# Patient Record
Sex: Male | Born: 1980 | Race: Black or African American | Hispanic: No | Marital: Single | Smoking: Current every day smoker
Health system: Southern US, Community
[De-identification: ages and names within clinical notes are randomized; demographics above are authoritative.]

## PROBLEM LIST (undated history)

## (undated) DIAGNOSIS — F909 Attention-deficit hyperactivity disorder, unspecified type: Secondary | ICD-10-CM

## (undated) DIAGNOSIS — Z9109 Other allergy status, other than to drugs and biological substances: Secondary | ICD-10-CM

## (undated) DIAGNOSIS — F419 Anxiety disorder, unspecified: Secondary | ICD-10-CM

## (undated) DIAGNOSIS — J45909 Unspecified asthma, uncomplicated: Secondary | ICD-10-CM

---
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
## 2008-02-02 ENCOUNTER — Emergency Department (HOSPITAL_COMMUNITY): Admission: EM | Admit: 2008-02-02 | Discharge: 2008-02-02 | Payer: Self-pay | Admitting: Emergency Medicine

## 2008-02-05 ENCOUNTER — Emergency Department (HOSPITAL_COMMUNITY): Admission: EM | Admit: 2008-02-05 | Discharge: 2008-02-05 | Payer: Self-pay | Admitting: Emergency Medicine

## 2009-11-21 ENCOUNTER — Emergency Department (HOSPITAL_COMMUNITY): Admission: EM | Admit: 2009-11-21 | Discharge: 2009-11-21 | Payer: Self-pay | Admitting: Family Medicine

## 2011-01-09 ENCOUNTER — Inpatient Hospital Stay (INDEPENDENT_AMBULATORY_CARE_PROVIDER_SITE_OTHER)
Admission: RE | Admit: 2011-01-09 | Discharge: 2011-01-09 | Disposition: A | Discharge status: Home / Self Care | Payer: Self-pay | Source: Ambulatory Visit | Attending: Family Medicine | Admitting: Family Medicine

## 2011-01-09 DIAGNOSIS — M799 Soft tissue disorder, unspecified: Secondary | ICD-10-CM

## 2011-01-09 DIAGNOSIS — IMO0002 Reserved for concepts with insufficient information to code with codable children: Secondary | ICD-10-CM

## 2011-07-31 ENCOUNTER — Emergency Department (INDEPENDENT_AMBULATORY_CARE_PROVIDER_SITE_OTHER)
Admission: EM | Admit: 2011-07-31 | Discharge: 2011-07-31 | Disposition: A | Discharge status: Home / Self Care | Payer: Self-pay | Source: Home / Self Care | Attending: Emergency Medicine | Admitting: Emergency Medicine

## 2011-07-31 ENCOUNTER — Encounter (HOSPITAL_COMMUNITY): Payer: Self-pay | Admitting: *Deleted

## 2011-07-31 DIAGNOSIS — R112 Nausea with vomiting, unspecified: Secondary | ICD-10-CM

## 2011-07-31 MED ORDER — ONDANSETRON HCL 4 MG PO TABS: 4.0000 mg | ORAL_TABLET | Freq: Three times a day (TID) | ORAL | Status: AC | PRN

## 2011-07-31 NOTE — Discharge Instructions (Signed)
Go to www.goodrx.com to look up your medications. This will give you a list of where you can find your prescriptions at the most affordable prices.   Call [facility]  [phone]  If you have no primary doctor, here are some resources that may be helpful:  Medicaid-accepting [place] Providers:   - [facility]- [address]      [phone]      Mon-Fri 9am-7pm, Sat 9am-1pm   - [facility]- [address], [address]      [phone]   - [facility]- [address]      [phone]   - [facility]- [address] [place] [address]      [phone]   - [facility]- [address]      [phone]      Only accepts [place] Access Medicaid patients       after they have her name applied to their card   Self Pay (no insurance) in [place]:   - Sickle Cell Patients: Dr Kamuela Dean, [facility]      [address]      [phone]   - [facility]- [phone]   - Physician Referral Service- [phone]   - [facility]- [address]      [phone]   - [facility]- [address]   - [facility]- see information above      (Speak to Pam H if you do not have insurance)   - [facility]- [address]      [phone]   - [facility] [place]- [address]      [phone]   - [facility]- [address] [place] [address]      [phone]   - Dr Osei-Bonsu-  [address], [place]      [phone]   - [facility]- [address]      [phone]   - [facility]- [address] [place] [address]      [phone]      Also [address]      [phone]   - [facility]- [address]      [phone]      1st and 3rd Saturday every month, 10am-1pm    Other agencies that provide inexpensive medical care:     [facility]  [phone]     Internal Medicine  [phone]    [facility]  [phone]    Women's Clinic  [phone] [place] [address]    [facility]  [phone]    [facility]  [phone] [facility] [phone]   [address] [place],  [phone]  Chronic Pain Problems Contact  [facility]  [phone] Patients need to be referred by their primary care doctor.  [place] Resources  [facility] [place]     [employer]                          [place] [facility] [address] [place]                         [address]      [address]   [phone] (After Hours)  General Information: Finding a doctor when you do not have health insurance can be tricky. Although you are not limited by an insurance plan, you are of course limited by her finances and how much but he can pay out of pocket.  What are your options if you don't have health insurance?   1) Find a Doctor and Pay Out of Pocket Although you won't have to find out who is covered by your insurance plan, it is a good idea to ask around and get recommendations. You will then need to call the office and see if the doctor you have chosen will accept you as a new patient and what types of options they offer for patients who are self-pay. Some doctors offer discounts or will set up payment plans for their patients who do not have insurance, but you will need to ask so you aren't surprised when you get to your appointment.  2) Contact Your Local Health Department Not all health departments have doctors that can see patients for sick visits, but many do, so it is worth a call to see if yours does. If you don't know where your local health department is, you can check in your phone book. The CDC also has a tool to help you locate your state's health department, and many state websites also have listings of  all of their local health departments.  3) Find a Walk-in Clinic If your illness is not likely to be very severe or complicated, you may want to try a walk in clinic. These are popping up all over the country in pharmacies, drugstores, and shopping centers. They're usually staffed by nurse practitioners or physician assistants that have been trained to treat common illnesses and complaints. They're usually fairly quick and inexpensive. However, if you have serious medical issues or chronic medical problems, these are probably not your best option     

## 2011-07-31 NOTE — ED Provider Notes (Signed)
History     CSN: [id number]  Arrival date & time 07/31/11  1345   First MD Initiated Contact with Patient 07/31/11 1402      Chief Complaint  Patient presents with  . Nausea  . Emesis    (Consider location/radiation/quality/duration/timing/severity/associated sxs/prior treatment) HPI Comments: Patient reports 10 minutes of retching and vomiting after "doing shots" of vodka last night. Patient started vomiting around 4 AM. Reports vomiting of stomach contents. No blood, bile. There is no abdominal pain. The vomiting has since resolved, feel somewhat nauseous, but is hungry. He is able to drink fluids without difficulty. No chest pain, shortness of breath. Denies dizziness, presyncope, lightheadedness. No urinary complaints, no decreased urine output. States he ate steak and cheese for dinner last night,. No leftovers, recent travel, no sick contacts. No medication or antibiotic use. States he drinks rarely, and last night was significantly more alcohol than he usually drinks. Denies any coingestants or concurrent illicit substance use. Patient is otherwise healthy.   ROS as noted in HPI. All other ROS negative.   Patient is a 31 y.o. male presenting with vomiting. The history is provided by the patient. No language interpreter was used.  Emesis  This is a new problem. The current episode started 6 to 12 hours ago. The problem has been resolved. The emesis has an appearance of stomach contents. There has been no fever. Pertinent negatives include no abdominal pain, no chills, no cough, no diarrhea, no fever, no myalgias and no URI.    History reviewed. No pertinent past medical history.  History reviewed. No pertinent past surgical history.  History reviewed. No pertinent family history.  History  Substance Use Topics  . Smoking status: Current Everyday Smoker  . Smokeless tobacco: Not on file  . Alcohol Use: Yes      Review of Systems  Constitutional: Negative for fever and  chills.  Respiratory: Negative for cough.   Gastrointestinal: Positive for vomiting. Negative for abdominal pain and diarrhea.  Musculoskeletal: Negative for myalgias.    Allergies  Review of patient's allergies indicates no known allergies.  Home Medications   Current Outpatient Rx  Name Route Sig Dispense Refill  . ONDANSETRON HCL 4 MG PO TABS Oral Take 1 tablet (4 mg total) by mouth every 8 (eight) hours as needed for nausea. May cause constipation 20 tablet 0    BP 128/88  Pulse 73  Temp(Src) 98 F (36.7 C) (Oral)  Resp 18  SpO2 99%  Physical Exam  Nursing note and vitals reviewed. Constitutional: He is oriented to person, place, and time. He appears well-developed and well-nourished. No distress.        Mucous membranes moist. Appears well hydrated  HENT:  Head: Normocephalic and atraumatic.  Eyes: Conjunctivae and EOM are normal.  Neck: Normal range of motion.  Cardiovascular: Normal rate, regular rhythm and normal heart sounds.   Pulmonary/Chest: Effort normal and breath sounds normal. No respiratory distress.  Abdominal: Soft. Normal appearance and bowel sounds are normal. He exhibits no distension and no mass. There is no tenderness. There is no rebound and no guarding.  Musculoskeletal: Normal range of motion. He exhibits no edema.  Neurological: He is alert and oriented to person, place, and time.  Skin: Skin is warm and dry. No rash noted.  Psychiatric: He has a normal mood and affect. His behavior is normal. Judgment and thought content normal.    ED Course  Procedures (including critical care time)  Labs Reviewed - No data  to display No results found.   1. Nausea & vomiting     MDM  Presentation most consistent with vomiting from excessive alcohol intake last night. Vitals normal, patient appears well hydrated, Abdomen benign, patient drinking ginger ale while in department, states he is hungry. Pt abd exam is benign, no peritoneal signs. No evidence  of surgical abd. Doubt SBO, mesenteric ischemia, appendicitis, hepatitis, cholecystitis, pancreatitis, or perforated viscus. No evidence to suggest testicular source for abdominal pain. Discussed with patient the dangers of excessive alcohol intake, sending home with Zofran. He'll followup with PMD of choice as needed. Patient is a plan   Luiz Blare, MD 07/31/11 1530

## 2011-07-31 NOTE — ED Notes (Signed)
Pt with onset of nausea and vomiting  4am this morning - vomited for approx 15 min no further vomiting - remains with nausea - denies pain -  drinking juice no solid foods

## 2014-01-07 ENCOUNTER — Emergency Department (HOSPITAL_COMMUNITY)
Admission: EM | Admit: 2014-01-07 | Discharge: 2014-01-08 | Disposition: A | Discharge status: Home / Self Care | Payer: Self-pay | Attending: Emergency Medicine | Admitting: Emergency Medicine

## 2014-01-07 ENCOUNTER — Emergency Department (HOSPITAL_COMMUNITY): Payer: Self-pay

## 2014-01-07 ENCOUNTER — Encounter (HOSPITAL_COMMUNITY): Payer: Self-pay | Admitting: Emergency Medicine

## 2014-01-07 DIAGNOSIS — M545 Low back pain, unspecified: Secondary | ICD-10-CM

## 2014-01-07 DIAGNOSIS — S3992XA Unspecified injury of lower back, initial encounter: Secondary | ICD-10-CM | POA: Insufficient documentation

## 2014-01-07 DIAGNOSIS — Z720 Tobacco use: Secondary | ICD-10-CM | POA: Insufficient documentation

## 2014-01-07 DIAGNOSIS — Y9389 Activity, other specified: Secondary | ICD-10-CM | POA: Insufficient documentation

## 2014-01-07 DIAGNOSIS — Y92410 Unspecified street and highway as the place of occurrence of the external cause: Secondary | ICD-10-CM | POA: Insufficient documentation

## 2014-01-07 DIAGNOSIS — R52 Pain, unspecified: Secondary | ICD-10-CM

## 2014-01-07 DIAGNOSIS — S161XXA Strain of muscle, fascia and tendon at neck level, initial encounter: Secondary | ICD-10-CM | POA: Insufficient documentation
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
## 2014-01-08 MED ORDER — HYDROCODONE-ACETAMINOPHEN 5-325 MG PO TABS: 1.0000 | ORAL_TABLET | Freq: Four times a day (QID) | ORAL | Status: DC | PRN

## 2014-01-08 MED ORDER — CYCLOBENZAPRINE HCL 10 MG PO TABS: 10.0000 mg | ORAL_TABLET | Freq: Every day | ORAL | Status: DC

## 2014-01-08 MED ORDER — IBUPROFEN 600 MG PO TABS: 600.0000 mg | ORAL_TABLET | Freq: Three times a day (TID) | ORAL | Status: DC | PRN

## 2014-01-08 NOTE — ED Notes (Signed)
Pt A&ox4, AMBULATORY AT D/C WITH STEADY GAIT, NAD
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
## 2014-02-12 ENCOUNTER — Encounter (HOSPITAL_COMMUNITY): Payer: Self-pay

## 2014-02-12 ENCOUNTER — Emergency Department (HOSPITAL_COMMUNITY)
Admission: EM | Admit: 2014-02-12 | Discharge: 2014-02-12 | Disposition: A | Discharge status: Home / Self Care | Payer: No Typology Code available for payment source | Attending: Emergency Medicine | Admitting: Emergency Medicine

## 2014-02-12 DIAGNOSIS — R0789 Other chest pain: Secondary | ICD-10-CM | POA: Insufficient documentation

## 2014-02-12 DIAGNOSIS — Z8701 Personal history of pneumonia (recurrent): Secondary | ICD-10-CM | POA: Insufficient documentation

## 2014-02-12 DIAGNOSIS — Z720 Tobacco use: Secondary | ICD-10-CM | POA: Insufficient documentation

## 2014-02-12 DIAGNOSIS — B9789 Other viral agents as the cause of diseases classified elsewhere: Secondary | ICD-10-CM

## 2014-02-12 DIAGNOSIS — J069 Acute upper respiratory infection, unspecified: Secondary | ICD-10-CM

## 2014-02-12 MED ORDER — ALBUTEROL SULFATE HFA 108 (90 BASE) MCG/ACT IN AERS: 1.0000 | INHALATION_SPRAY | Freq: Four times a day (QID) | RESPIRATORY_TRACT | Status: DC | PRN

## 2014-02-12 MED ORDER — BENZONATATE 100 MG PO CAPS: 100.0000 mg | ORAL_CAPSULE | Freq: Three times a day (TID) | ORAL | Status: DC

## 2014-02-12 MED ORDER — LORATADINE 10 MG PO TABS: 10.0000 mg | ORAL_TABLET | Freq: Every day | ORAL | Status: DC

## 2014-02-12 NOTE — ED Provider Notes (Signed)
CSN: [id number]     Arrival date & time 02/12/14  1528 History  This chart was scribed for Donald Piedraourtney Forcucci, PA-C, working with Elwin MochaBlair Walden, MD by Jolene Provostobert Halas, ED Scribe. This patient was seen in room WTR6/WTR6 and the patient's care was started at 5:04 PM.     Chief Complaint  Patient presents with  . Cough  . Nasal Congestion    HPI  HPI Comments: Donald Maldonado is a 33 y.o. male who presents to the Emergency Department complaining of nasal congestion with associated productive cough that started one week ago. Pt endorses associated rhinorrhea with white discharge, wheezing, SOB, and muscular chest pain when coughing. Pt denies ear pain, night sweats, or fever. Pt states he has a past hx of pneumonia. Pt has NKDA. PT smokes, 1 PPD, pt has smoked for 12 years.   History reviewed. No pertinent past medical history. History reviewed. No pertinent past surgical history. No family history on file. History  Substance Use Topics  . Smoking status: Current Every Day Smoker  . Smokeless tobacco: Not on file  . Alcohol Use: Yes     Comment: socially     Review of Systems  Constitutional: Positive for chills. Negative for fever.  HENT: Positive for congestion, rhinorrhea and sinus pressure. Negative for ear pain.   Respiratory: Positive for cough, shortness of breath and wheezing.   Psychiatric/Behavioral: Positive for sleep disturbance.    Allergies  Review of patient's allergies indicates no known allergies.  Home Medications   Prior to Admission medications   Medication Sig Start Date End Date Taking? Authorizing Provider  albuterol (PROVENTIL HFA;VENTOLIN HFA) 108 (90 BASE) MCG/ACT inhaler Inhale 1-2 puffs into the lungs every 6 (six) hours as needed for wheezing or shortness of breath. 02/12/14   [name] A Forcucci, PA-C  benzonatate (TESSALON) 100 MG capsule Take 1 capsule (100 mg total) by mouth every 8 (eight) hours. 02/12/14   [name] A Forcucci, PA-C  cyclobenzaprine  (FLEXERIL) 10 MG tablet Take 1 tablet (10 mg total) by mouth at bedtime. 01/08/14   [name], PA-C  HYDROcodone-acetaminophen (NORCO/VICODIN) 5-325 MG per tablet Take 1 tablet by mouth every 6 (six) hours as needed. 01/08/14   [name], PA-C  ibuprofen (ADVIL,MOTRIN) 600 MG tablet Take 1 tablet (600 mg total) by mouth every 8 (eight) hours as needed. 01/08/14   [name], PA-C  loratadine (CLARITIN) 10 MG tablet Take 1 tablet (10 mg total) by mouth daily. 02/12/14   [name] A Forcucci, PA-C   BP 110/76 mmHg  Pulse 78  Temp(Src) 98.1 F (36.7 C) (Oral)  Resp 14  SpO2 96% Physical Exam  Constitutional: He is oriented to person, place, and time. He appears well-developed and well-nourished.  HENT:  Head: Normocephalic and atraumatic.  Right Ear: Tympanic membrane and ear canal normal.  Left Ear: Tympanic membrane and ear canal normal.  Nose: Mucosal edema present.  Mouth/Throat: Uvula is midline, oropharynx is clear and moist and mucous membranes are normal.  Eyes: EOM are normal. Pupils are equal, round, and reactive to light.  Neck: Neck supple.  Cardiovascular: Normal rate, regular rhythm and normal heart sounds.  Exam reveals no gallop and no friction rub.   No murmur heard. Pulmonary/Chest: Effort normal and breath sounds normal. No respiratory distress. He has no wheezes. He has no rales.  Muscular chest pain.   Neurological: He is alert and oriented to person, place, and time.  Skin: Skin is warm and dry.  Psychiatric: He  has a normal mood and affect. His behavior is normal.  Nursing note and vitals reviewed.   ED Course  Procedures  DIAGNOSTIC STUDIES: Oxygen Saturation is 96% on RA, normal by my interpretation.    COORDINATION OF CARE: 5:12 PM Discussed treatment plan with pt at bedside and pt agreed to plan.  Labs Review Labs Reviewed - No data to display  Imaging Review No results found.   EKG Interpretation None      MDM   Final diagnoses:   Viral URI with cough   Patient is a 33 year old male who presents to the emergency room for evaluation of cough, congestion, and chest pain while coughing. Physical exam reveals alert nontoxic-appearing male with normal vital signs. Lungs are clear to auscultation. I do not feel that given the amount of time of symptoms, normal vital signs, and clear lungs that this is likely pneumonia. Suspect that this is much more likely to be a viral upper respiratory infection at this time. I will treat symptomatically with antihistamine, cough syrup, albuterol inhaler, and nasal saline. Patient to return for symptoms of pneumonia which we covered in the room. Patient states understanding and agreement at this time. Patient is stable for discharge.  I personally performed the services described in this documentation, which was scribed in my presence. The recorded information has been reviewed and is accurate.    Eben Burowourtney A Forcucci, PA-C 02/12/14 1719  Elwin MochaBlair Walden, MD 02/12/14 [phone]

## 2014-02-12 NOTE — Discharge Instructions (Signed)
Upper Respiratory Infection, Adult An upper respiratory infection (URI) is also sometimes known as the common cold. The upper respiratory tract includes the nose, sinuses, throat, trachea, and bronchi. Bronchi are the airways leading to the lungs. Most people improve within 1 week, but symptoms can last up to 2 weeks. A residual cough may last even longer.  CAUSES Many different viruses can infect the tissues lining the upper respiratory tract. The tissues become irritated and inflamed and often become very moist. Mucus production is also common. A cold is contagious. You can easily spread the virus to others by oral contact. This includes kissing, sharing a glass, coughing, or sneezing. Touching your mouth or nose and then touching a surface, which is then touched by another person, can also spread the virus. SYMPTOMS  Symptoms typically develop 1 to 3 days after you come in contact with a cold virus. Symptoms vary from person to person. They may include:  Runny nose.  Sneezing.  Nasal congestion.  Sinus irritation.  Sore throat.  Loss of voice (laryngitis).  Cough.  Fatigue.  Muscle aches.  Loss of appetite.  Headache.  Low-grade fever. DIAGNOSIS  You might diagnose your own cold based on familiar symptoms, since most people get a cold 2 to 3 times a year. Your caregiver can confirm this based on your exam. Most importantly, your caregiver can check that your symptoms are not due to another disease such as strep throat, sinusitis, pneumonia, asthma, or epiglottitis. Blood tests, throat tests, and X-rays are not necessary to diagnose a common cold, but they may sometimes be helpful in excluding other more serious diseases. Your caregiver will decide if any further tests are required. RISKS AND COMPLICATIONS  You may be at risk for a more severe case of the common cold if you smoke cigarettes, have chronic heart disease (such as heart failure) or lung disease (such as asthma), or if  you have a weakened immune system. The very young and very old are also at risk for more serious infections. Bacterial sinusitis, middle ear infections, and bacterial pneumonia can complicate the common cold. The common cold can worsen asthma and chronic obstructive pulmonary disease (COPD). Sometimes, these complications can require emergency medical care and may be life-threatening. PREVENTION  The best way to protect against getting a cold is to practice good hygiene. Avoid oral or hand contact with people with cold symptoms. Wash your hands often if contact occurs. There is no clear evidence that vitamin C, vitamin E, echinacea, or exercise reduces the chance of developing a cold. However, it is always recommended to get plenty of rest and practice good nutrition. TREATMENT  Treatment is directed at relieving symptoms. There is no cure. Antibiotics are not effective, because the infection is caused by a virus, not by bacteria. Treatment may include:  Increased fluid intake. Sports drinks offer valuable electrolytes, sugars, and fluids.  Breathing heated mist or steam (vaporizer or shower).  Eating chicken soup or other clear broths, and maintaining good nutrition.  Getting plenty of rest.  Using gargles or lozenges for comfort.  Controlling fevers with ibuprofen or acetaminophen as directed by your caregiver.  Increasing usage of your inhaler if you have asthma. Zinc gel and zinc lozenges, taken in the first 24 hours of the common cold, can shorten the duration and lessen the severity of symptoms. Pain medicines may help with fever, muscle aches, and throat pain. A variety of non-prescription medicines are available to treat congestion and runny nose. Your caregiver   can make recommendations and may suggest nasal or lung inhalers for other symptoms.  HOME CARE INSTRUCTIONS   Only take over-the-counter or prescription medicines for pain, discomfort, or fever as directed by your  caregiver.  Use a warm mist humidifier or inhale steam from a shower to increase air moisture. This may keep secretions moist and make it easier to breathe.  Drink enough water and fluids to keep your urine clear or pale yellow.  Rest as needed.  Return to work when your temperature has returned to normal or as your caregiver advises. You may need to stay home longer to avoid infecting others. You can also use a face mask and careful hand washing to prevent spread of the virus. SEEK MEDICAL CARE IF:   After the first few days, you feel you are getting worse rather than better.  You need your caregiver's advice about medicines to control symptoms.  You develop chills, worsening shortness of breath, or brown or red sputum. These may be signs of pneumonia.  You develop yellow or brown nasal discharge or pain in the face, especially when you bend forward. These may be signs of sinusitis.  You develop a fever, swollen neck glands, pain with swallowing, or white areas in the back of your throat. These may be signs of strep throat. SEEK IMMEDIATE MEDICAL CARE IF:   You have a fever.  You develop severe or persistent headache, ear pain, sinus pain, or chest pain.  You develop wheezing, a prolonged cough, cough up blood, or have a change in your usual mucus (if you have chronic lung disease).  You develop sore muscles or a stiff neck. Document Released: 08/18/2000 Document Revised: 05/17/2011 Document Reviewed: 05/30/2013 [employer] Patient Information 2015 [employer]. This information is not intended to replace advice given to you by your health care provider. Make sure you discuss any questions you have with your health care provider.  

## 2014-02-12 NOTE — ED Notes (Signed)
Pt presents with c/o cough and congestion that started approx one week ago. Pt not c/o any chest pain at this time.

## 2015-03-21 ENCOUNTER — Other Ambulatory Visit: Payer: Self-pay

## 2015-03-21 ENCOUNTER — Encounter (HOSPITAL_COMMUNITY): Payer: Self-pay

## 2015-03-21 ENCOUNTER — Emergency Department (HOSPITAL_COMMUNITY): Payer: Self-pay

## 2015-03-21 ENCOUNTER — Emergency Department (HOSPITAL_COMMUNITY)
Admission: EM | Admit: 2015-03-21 | Discharge: 2015-03-21 | Disposition: A | Discharge status: Home / Self Care | Payer: Self-pay | Attending: Emergency Medicine | Admitting: Emergency Medicine

## 2015-03-21 DIAGNOSIS — Z79899 Other long term (current) drug therapy: Secondary | ICD-10-CM | POA: Insufficient documentation

## 2015-03-21 DIAGNOSIS — F17200 Nicotine dependence, unspecified, uncomplicated: Secondary | ICD-10-CM | POA: Insufficient documentation

## 2015-03-21 DIAGNOSIS — K219 Gastro-esophageal reflux disease without esophagitis: Secondary | ICD-10-CM

## 2015-03-21 LAB — I-STAT TROPONIN, ED: Troponin i, poc: 0 ng/mL (ref 0.00–0.08)

## 2015-03-21 LAB — HEPATIC FUNCTION PANEL
ALBUMIN: 4.4 g/dL (ref 3.5–5.0)
ALT: 11 U/L — AB (ref 17–63)
AST: 22 U/L (ref 15–41)
Alkaline Phosphatase: 56 U/L (ref 38–126)
BILIRUBIN DIRECT: 0.2 mg/dL (ref 0.1–0.5)
BILIRUBIN TOTAL: 0.6 mg/dL (ref 0.3–1.2)
Indirect Bilirubin: 0.4 mg/dL (ref 0.3–0.9)
Total Protein: 7.5 g/dL (ref 6.5–8.1)

## 2015-03-21 LAB — BASIC METABOLIC PANEL
Anion gap: 11 (ref 5–15)
BUN: 8 mg/dL (ref 6–20)
CHLORIDE: 104 mmol/L (ref 101–111)
CO2: 26 mmol/L (ref 22–32)
Calcium: 9.5 mg/dL (ref 8.9–10.3)
Creatinine, Ser: 0.89 mg/dL (ref 0.61–1.24)
GFR calc Af Amer: >60 mL/min (ref >60)
GLUCOSE: 83 mg/dL (ref 65–99)
POTASSIUM: 4.3 mmol/L (ref 3.5–5.1)
Sodium: 141 mmol/L (ref 135–145)

## 2015-03-21 LAB — CBC
HCT: 46.2 % (ref 39.0–52.0)
HEMOGLOBIN: 14.8 g/dL (ref 13.0–17.0)
MCH: 24.3 pg — ABNORMAL LOW (ref 26.0–34.0)
MCHC: 32 g/dL (ref 30.0–36.0)
MCV: 75.7 fL — AB (ref 78.0–100.0)
Platelets: 299 10*3/uL (ref 150–400)
RBC: 6.1 MIL/uL — ABNORMAL HIGH (ref 4.22–5.81)
RDW: 13.5 % (ref 11.5–15.5)
WBC: 5 10*3/uL (ref 4.0–10.5)

## 2015-03-21 LAB — LIPASE, BLOOD: Lipase: 28 U/L (ref 11–51)

## 2015-03-21 MED ORDER — FAMOTIDINE IN NACL 20-0.9 MG/50ML-% IV SOLN
20.0000 mg | Freq: Once | INTRAVENOUS | Status: AC
  Administered 2015-03-21: 20 mg via INTRAVENOUS
  Filled 2015-03-21: qty 50

## 2015-03-21 MED ORDER — FAMOTIDINE 20 MG PO TABS: 20.0000 mg | ORAL_TABLET | Freq: Two times a day (BID) | ORAL | Status: DC

## 2015-03-21 MED ORDER — GI COCKTAIL ~~LOC~~
30.0000 mL | Freq: Once | ORAL | Status: AC
  Administered 2015-03-21: 30 mL via ORAL
  Filled 2015-03-21: qty 30

## 2015-03-21 NOTE — ED Notes (Signed)
Patient here with chest pain x 1 day. Pain more epigastric area with food intake, resolves after a few hours of a meal

## 2015-03-21 NOTE — ED Notes (Signed)
POt home ambulatory after verbaLIZES UNDERSTANDING OF INSTRUCTIONS.

## 2015-03-21 NOTE — Discharge Instructions (Signed)
Gastroesophageal Reflux Disease, Adult Avoid foods listed below. Follow-up with a primary care provider or use the resource guide below to find one. Take Pepcid as prescribed. Discontinue spicy foods, sodas, and NSAIDs until you are evaluated by physician. Return for vomiting, bloody stools, or inability to tolerate fluids. Normally, food travels down the esophagus and stays in the stomach to be digested. If a person has gastroesophageal reflux disease (GERD), food and stomach acid move back up into the esophagus. When this happens, the esophagus becomes sore and swollen (inflamed). Over time, GERD can make small holes (ulcers) in the lining of the esophagus. HOME CARE Diet  Follow a diet as told by your doctor. You may need to avoid foods and drinks such as:  Coffee and tea (with or without caffeine).  Drinks that contain alcohol.  Energy drinks and sports drinks.  Carbonated drinks or sodas.  Chocolate and cocoa.  Peppermint and mint flavorings.  Garlic and onions.  Horseradish.  Spicy and acidic foods, such as peppers, chili powder, curry powder, vinegar, hot sauces, and BBQ sauce.  Citrus fruit juices and citrus fruits, such as oranges, lemons, and limes.  Tomato-based foods, such as red sauce, chili, salsa, and pizza with red sauce.  Fried and fatty foods, such as donuts, french fries, potato chips, and high-fat dressings.  High-fat meats, such as hot dogs, rib eye steak, sausage, ham, and bacon.  High-fat dairy items, such as whole milk, butter, and cream cheese.  Eat small meals often. Avoid eating large meals.  Avoid drinking large amounts of liquid with your meals.  Avoid eating meals during the 2-3 hours before bedtime.  Avoid lying down right after you eat.  Do not exercise right after you eat. General Instructions  Pay attention to any changes in your symptoms.  Take over-the-counter and prescription medicines only as told by your doctor. Do not take  aspirin, ibuprofen, or other NSAIDs unless your doctor says it is okay.  Do not use any tobacco products, including cigarettes, chewing tobacco, and e-cigarettes. If you need help quitting, ask your doctor.  Wear loose clothes. Do not wear anything tight around your waist.  Raise (elevate) the head of your bed about 6 inches (15 cm).  Try to lower your stress. If you need help doing this, ask your doctor.  If you are overweight, lose an amount of weight that is healthy for you. Ask your doctor about a safe weight loss goal.  Keep all follow-up visits as told by your doctor. This is important. GET HELP IF:  You have new symptoms.  You lose weight and you do not know why it is happening.  You have trouble swallowing, or it hurts to swallow.  You have wheezing or a cough that keeps happening.  Your symptoms do not get better with treatment.  You have a hoarse voice. GET HELP RIGHT AWAY IF:  You have pain in your arms, neck, jaw, teeth, or back.  You feel sweaty, dizzy, or light-headed.  You have chest pain or shortness of breath.  You throw up (vomit) and your throw up looks like blood or coffee grounds.  You pass out (faint).  Your poop (stool) is bloody or black.  You cannot swallow, drink, or eat.   This information is not intended to replace advice given to you by your health care provider. Make sure you discuss any questions you have with your health care provider.   Document Released: 08/11/2007 Document Revised: 11/13/2014 Document Reviewed: 06/19/2014  Risk analyst Patient Education [employer]  Emergency Department Resource Guide 1) Find a Doctor and Pay Out of Pocket Although you won't have to find out who is covered by your insurance plan, it is a good idea to ask around and get recommendations. You will then need to call the office and see if the doctor you have chosen will accept you as a new patient and what types of options they offer for  patients who are self-pay. Some doctors offer discounts or will set up payment plans for their patients who do not have insurance, but you will need to ask so you aren't surprised when you get to your appointment.  2) Contact Your Local Health Department Not all health departments have doctors that can see patients for sick visits, but many do, so it is worth a call to see if yours does. If you don't know where your local health department is, you can check in your phone book. The CDC also has a tool to help you locate your state's health department, and many state websites also have listings of all of their local health departments.  3) Find a Walk-in Clinic If your illness is not likely to be very severe or complicated, you may want to try a walk in clinic. These are popping up all over the country in pharmacies, drugstores, and shopping centers. They're usually staffed by nurse practitioners or physician assistants that have been trained to treat common illnesses and complaints. They're usually fairly quick and inexpensive. However, if you have serious medical issues or chronic medical problems, these are probably not your best option.  No Primary Care Doctor: - Call Health Connect at  [phone] - they can help you locate a primary care doctor that  accepts your insurance, provides certain services, etc. - Physician Referral Service- [phone]  Chronic Pain Problems: Organization         Address  Phone   Notes  [facility]  [phone] Patients need to be referred by their primary care doctor.   Medication Assistance: Organization         Address  Phone   Notes  [facility] Medication [facility] [address] [place] [place], [place] [address] [phone] --Must be a resident of [facility] -- Must have NO insurance coverage whatsoever (no Medicaid/ Medicare, etc.) -- The pt. MUST have a primary care doctor that directs their care regularly  and follows them in the community   [facility]  [phone]   [employer]  [phone]    Agencies that provide inexpensive medical care: Organization         Address  Phone   Notes  [facility]  [phone]   [facility]    [phone]   [facility] [address] [place], [place] [address] [phone]   [facility] [address] [place] [place] [phone]   Planned Parenthood    [phone]   [facility]    [phone]   [facility]  [address], [place] Phone:  [phone], Fax:  [phone] Hours of Operation:  9 am - 6 pm, M-F.  Also accepts Medicaid/Medicare and self-pay.  [facility]  [address], [place] Phone: [phone], Fax: [phone]. Hours of Operation:  8:30 am - 5:30 pm, M-F.  Also  accepts Medicaid and self-pay.  [facility] [place] [address] [place] Phone: [phone]   [facility] [address] Natasha Bence [place], [place] [phone], Ext. [phone] Mondays & Thursdays: 7-9 AM.  First 15 patients are seen on a first come, first serve basis.    Medicaid-accepting [facility] Providers:  Organization         Address  Phone   Notes  [facility] [address], [place] [phone] Also accepts self-pay patients.  [facility] [address] Laurell Josephs [place], [place]  [phone]   [facility] [address], [place] [phone]   [facility] [address], [place] [phone]   Renaye Rakers [address], [place]   [phone] Only accepts [place] patients after they have their name applied to their card.   Self-Pay (no insurance) in [facility]:  Organization         Address  Phone   Notes  Sickle  Cell Patients, [facility] Internal Medicine [address] [place], [place] [phone]   [facility] [address] [place], [place] [phone]   [facility] [place]  [address], [place] [phone]   [facility]/Dr. Osei-Bonsu  [address], [place] or [address], [place] [phone] Phone number for both [place] and [place] locations is the same.  [facility] [address] [place] [phone]   [facility] [address], [place] or [address] [phone]   [facility] [address], [place] [phone], phone; [phone], fax Sees patients 1st and 3rd Saturday of every month.  Must not qualify for public or private insurance (i.e. Medicaid, Medicare, [place] Health Choice, Veterans' Benefits)  Household income should be no more than 200% of the poverty level The clinic cannot treat you if you are pregnant or think you are pregnant  Sexually transmitted diseases are not treated at the clinic.    Dental Care: Organization         Address  Phone  Notes  [facility] Department of [facility] [address] [place], [place] [phone] Accepts children up to age 56 who are enrolled in [place] or [place] Health Choice; pregnant women with a Medicaid card; and children who have applied for Medicaid or [place] Health Choice, but were declined, whose parents can pay a reduced fee at time of service.  [facility] Department of [facility]  [address], [place] [phone] Accepts children up to age 63 who are enrolled in [place] or [place] Health Choice; pregnant women with a Medicaid card; and children who have applied for Medicaid or [place] Health Choice, but were declined, whose parents can pay a reduced fee at time of service.  [facility]  [address] [place], [place] [phone] Patients are seen by appointment only. Walk-ins are not accepted. [facility] will see patients 76 years of age and older. Monday - Tuesday (8am-5pm) Most Wednesdays (8:30-5pm) $30 per visit, cash only  [facility] Adult Dental Access PROGRAM  [address], [facility] [phone] Patients are seen by appointment only. Walk-ins are not accepted. [facility] will  see patients 21 years of age and older. One Wednesday Evening (Monthly: Volunteer Based).  $30 per visit, cash only  [employer]  [phone] for adults; Children under age 71, call Graduate Pediatric Dentistry at [phone]. Children aged 49-14, please call [phone] to request a pediatric application.  Dental services are provided in all areas of dental care including fillings, crowns and bridges, complete and partial dentures, implants, gum treatment, root canals, and extractions. Preventive care is also provided. Treatment is provided to both adults and children. Patients are selected via a lottery and there is often a waiting list.   [facility] [address], [place]  [phone] [email or web address]   [facility] [address] [place], [place] [phone], Ext. [phone] Second and Fourth Thursday of each month, opens at 6:30 AM; Clinic ends at 9 AM.  Patients are seen on a first-come first-served basis, and a limited number are seen during each clinic.   [facility]  [address] Ether Griffins [place], [place] [phone]   Eligibility Requirements You must have lived in [place], [place], or [place] counties for at least the last three months.   You cannot be eligible for state or federal sponsored [place], including [employer], [place], or [employer].   You generally cannot be eligible for healthcare insurance through your employer.    How to apply: Eligibility  screenings are held every Tuesday and Wednesday afternoon from 1:00 pm until 4:00 pm. You do not need an appointment for the interview!  [facility] [address] [place], [place] [phone]   [facility] Health Department  [phone]   [facility] Health Department  [phone]   [facility] Health Department  [phone]    Behavioral Health Resources in the Community: Intensive Outpatient Programs Organization         Address  Phone  Notes  [facility] [address], [place], [place] [phone]   [facility] Outpatient [address], [place], [place] [phone]   [facility] [address] [place], [place]  [phone]   [facility] Mental Health [address],  [place], [place] [phone] or [phone]   Substance Abuse Resources Organization         Address  Phone  Notes  [facility]  [phone]   [facility]  [phone]   [facility] [place]  [phone]   Floydene Flock  [phone]   Residential & Outpatient Substance Abuse Program  [phone]   Psychological Services Organization         Address  Phone  Notes  [facility]  [phone]   [facility]  [phone]   [facility] Mental Health [address] [place] [phone] or [phone]    Mobile Crisis Teams Organization         Address  Phone  Notes  [facility], Mobile Crisis Care Unit  [phone]   [facility]  [address] [place], [place] [phone]   Doristine Locks [address] [place] [phone]    Self-Help/Support Groups Organization         Address  Phone             Notes  [facility] [place] - variety of support groups  [phone]- [phone] Call for more information  Narcotics Anonymous (NA), [facility] [address], [employer]  [place]  2 meetings at  this location   Residential Treatment Programs Organization         Address  Phone  Notes  [facility] [address],    [place]  [phone]   [facility]  [address], [place] [phone], [place], [place] [phone]   [facility] [address], [place] [phone] Admissions: 8am-3pm M-F  [facility] [address]    [place], [place] [phone]   [facility] [address], [place], [place] [phone]   [facility] [address]  [place], [place] [phone]   [facility] - Intensive Outpatient [address], [place], [place] [phone]   [facility] ([facility] [address]  [place], [place] [phone] or [phone]   [facility] ([facility]) [address] [place], [place] [phone] Accepts Medicaid  [facility] [address]  [place] [phone] Substance Abuse/Addiction Treatment   [facility] Behavioral Health Resources Organization         Address  Phone  Notes  [facility]  [phone]) [phone]   Angie FavaJulie Brannon, PhD [address] [place], [place]   [phone]) [phone] or [phone]   [facility]   [address] [place], [place] [phone]   [facility] [address], [place], [place] [phone]) [phone] Insurance/Medicaid/sponsorship through [facility]  [facility] [address]                                    [place], [place] [phone]) [phone] Therapy/tele-psych/case  [facility] [address]   [place], [place] [phone](336) [phone]    Dr. Lolly MustacheArfeen  [phone]   [facility]  [employer] [facility] 1) [address], [place] 2) [address], [place] 3)  [address], [place] [phone](336) [phone]) [phone]  [phone]) [phone]   [facility] Child Abuse Hotline [phone]) [phone] or [phone](336)  [phone] (After Hours)

## 2015-03-21 NOTE — ED Provider Notes (Signed)
CSN: [id number]     Arrival date & time 03/21/15  1202 History   First MD Initiated Contact with Patient 03/21/15 1226     Chief Complaint  Patient presents with  . Chest Pain   (Consider location/radiation/quality/duration/timing/severity/associated sxs/prior Treatment) Patient is a 35 y.o. male presenting with chest pain. The history is provided by the patient. No language interpreter was used.  Chest Pain Associated symptoms: abdominal pain   Associated symptoms: no fever, no nausea, no shortness of breath and not vomiting     Mr. Steffanie DunnCollier is a 35 year old male with no significant past medical history who presents for intermittent burning epigastric abdominal and chest pain 1 day. Worse after eating. He takes ibuprofen regularly and sometimes up to 4 times a day for back pain due to his occupation. He states he only eats once a day and usually takes the ibuprofen on an empty stomach. He also reports eating lots of hot sauce and drinking lots of sodas. Last bowel movement was yesterday. He smokes cigarettes. Denies any family history of cardiac disease. No recent travel or surgery. And eyes any recent illness, fever, chills, cough, shortness of breath, nausea, vomiting, diarrhea, hematochezia, or constipation.  History reviewed. No pertinent past medical history. History reviewed. No pertinent past surgical history. No family history on file. Social History  Substance Use Topics  . Smoking status: Current Every Day Smoker  . Smokeless tobacco: None  . Alcohol Use: Yes     Comment: socially     Review of Systems  Constitutional: Negative for fever and chills.  Respiratory: Negative for shortness of breath.   Cardiovascular: Positive for chest pain.  Gastrointestinal: Positive for abdominal pain. Negative for nausea, vomiting, diarrhea and constipation.  All other systems reviewed and are negative.     Allergies  Review of patient's allergies indicates no known  allergies.  Home Medications   Prior to Admission medications   Medication Sig Start Date End Date Taking? Authorizing Provider  ibuprofen (ADVIL,MOTRIN) 200 MG tablet Take 200 mg by mouth every 6 (six) hours as needed for moderate pain.   Yes Historical Provider, MD  albuterol (PROVENTIL HFA;VENTOLIN HFA) 108 (90 BASE) MCG/ACT inhaler Inhale 1-2 puffs into the lungs every 6 (six) hours as needed for wheezing or shortness of breath. Patient not taking: Reported on 03/21/2015 02/12/14   Toni Amendourtney Forcucci, PA-C  benzonatate (TESSALON) 100 MG capsule Take 1 capsule (100 mg total) by mouth every 8 (eight) hours. Patient not taking: Reported on 03/21/2015 02/12/14   Toni Amendourtney Forcucci, PA-C  cyclobenzaprine (FLEXERIL) 10 MG tablet Take 1 tablet (10 mg total) by mouth at bedtime. Patient not taking: Reported on 03/21/2015 01/08/14   [name], PA-C  famotidine (PEPCID) 20 MG tablet Take 1 tablet (20 mg total) by mouth 2 (two) times daily. 03/21/15   Ashkan Chamberland Patel-Mills, PA-C  HYDROcodone-acetaminophen (NORCO/VICODIN) 5-325 MG per tablet Take 1 tablet by mouth every 6 (six) hours as needed. Patient not taking: Reported on 03/21/2015 01/08/14   [name], PA-C  ibuprofen (ADVIL,MOTRIN) 600 MG tablet Take 1 tablet (600 mg total) by mouth every 8 (eight) hours as needed. Patient not taking: Reported on 03/21/2015 01/08/14   [name], PA-C  loratadine (CLARITIN) 10 MG tablet Take 1 tablet (10 mg total) by mouth daily. Patient not taking: Reported on 03/21/2015 02/12/14   Toni Amendourtney Forcucci, PA-C   BP 116/84 mmHg  Pulse 67  Temp(Src) 98.2 F (36.8 C) (Oral)  Resp 16  Ht 5\' 7"  (1.702 m)  Wt 58.968 kg  BMI 20.36 kg/m2  SpO2 100% Physical Exam  Constitutional: He is oriented to person, place, and time. He appears well-developed and well-nourished.  HENT:  Head: Normocephalic and atraumatic.  Eyes: Conjunctivae are normal.  Neck: Normal range of motion. Neck supple.  Cardiovascular: Normal rate,  regular rhythm and normal heart sounds.   No murmur heard. Regular rate and rhythm. No murmur.  Pulmonary/Chest: Effort normal and breath sounds normal. No respiratory distress. He has no wheezes. He has no rales. He exhibits no tenderness.  Lungs are clear to auscultation bilaterally. No reproducible chest tenderness. No respiratory distress or use of accessory muscles.  Abdominal: Soft. There is no tenderness.  No reproducible abdominal tenderness to palpation. No guarding or rebound. Normal-appearing abdomen.  Musculoskeletal: Normal range of motion. He exhibits no edema or tenderness.  No calf tenderness or edema.  Neurological: He is alert and oriented to person, place, and time.  Skin: Skin is warm and dry.  Nursing note and vitals reviewed.   ED Course  Procedures (including critical care time) Labs Review Labs Reviewed  CBC - Abnormal; Notable for the following:    RBC 6.10 (*)    MCV 75.7 (*)    MCH 24.3 (*)    All other components within normal limits  HEPATIC FUNCTION PANEL - Abnormal; Notable for the following:    ALT 11 (*)    All other components within normal limits  BASIC METABOLIC PANEL  LIPASE, BLOOD  I-STAT TROPOININ, ED    Imaging Review Dg Chest 2 View  03/21/2015  CLINICAL DATA:  Shortness of breath for 3 days EXAM: CHEST  2 VIEW COMPARISON:  02/02/2008 FINDINGS: Cardiomediastinal silhouette is stable. No acute infiltrate or pleural effusion. No pulmonary edema. Mild hyperinflation. Bony thorax is unremarkable. IMPRESSION: No active cardiopulmonary disease.  Mild hyperinflation. Electronically Signed   By: Natasha Mead M.D.   On: 03/21/2015 12:45   I have personally reviewed and evaluated these images and lab results as part of my medical decision-making. ED ECG REPORT   Date: 03/21/2015  Rate: 76  Rhythm: normal sinus rhythm  QRS Axis: normal  Intervals: normal  ST/T Wave abnormalities: normal  Conduction Disutrbances:none  Narrative Interpretation:    Old EKG Reviewed: none available  I have personally reviewed the EKG tracing and agree with the computerized printout as noted.   MDM   Final diagnoses:  Gastroesophageal reflux disease, esophagitis presence not specified  Patient presents for epigastric abdominal and chest burning sensation since yesterday. He mentioned that he uses a lot of NSAIDs daily due to chronic back pain.  He denies any hematochezia or hematemesis. He has no reproducible pain on exam and his exam is completely normal. Hemoglobin is stable. Chest x-ray is negative for pneumonia or edema. Troponin is negative and pain has been intermittent since yesterday.   Patient is perk negative. I reviewed the heart score and patient is at low risk for any major cardiac event.  This is most likely GERD or gastric ulcer without perforation. Patient was given GI cocktail. Discussed diet change with the patient as well as the possibility of having a gastric ulcer. Patient tolerating PO fluids. We discussed follow-up (resource guide given) as well as return precautions. Patient is agreeable with plan.  Catha Gosselin, PA-C 03/21/15 1521  Lavera Guise, MD 03/21/15 [phone]

## 2015-03-21 NOTE — ED Notes (Signed)
Pt given po fluids. 

## 2015-12-26 ENCOUNTER — Ambulatory Visit (HOSPITAL_COMMUNITY)
Admission: EM | Admit: 2015-12-26 | Discharge: 2015-12-26 | Disposition: A | Discharge status: Home / Self Care | Payer: Self-pay | Attending: Family Medicine | Admitting: Family Medicine

## 2015-12-26 ENCOUNTER — Encounter (HOSPITAL_COMMUNITY): Payer: Self-pay | Admitting: Emergency Medicine

## 2015-12-26 DIAGNOSIS — S161XXA Strain of muscle, fascia and tendon at neck level, initial encounter: Secondary | ICD-10-CM

## 2015-12-26 MED ORDER — CYCLOBENZAPRINE HCL 10 MG PO TABS: 10.0000 mg | ORAL_TABLET | Freq: Every day | ORAL | 0 refills | Status: DC

## 2015-12-26 MED ORDER — MELOXICAM 7.5 MG PO TABS: 7.5000 mg | ORAL_TABLET | Freq: Every day | ORAL | 0 refills | Status: DC
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
## 2017-08-16 ENCOUNTER — Ambulatory Visit (HOSPITAL_COMMUNITY)
Admission: EM | Admit: 2017-08-16 | Discharge: 2017-08-16 | Disposition: A | Discharge status: Home / Self Care | Payer: Self-pay | Attending: Family Medicine | Admitting: Family Medicine

## 2017-08-16 ENCOUNTER — Other Ambulatory Visit: Payer: Self-pay

## 2017-08-16 ENCOUNTER — Encounter (HOSPITAL_COMMUNITY): Payer: Self-pay | Admitting: Emergency Medicine

## 2017-08-16 DIAGNOSIS — Z202 Contact with and (suspected) exposure to infections with a predominantly sexual mode of transmission: Secondary | ICD-10-CM | POA: Insufficient documentation

## 2017-08-16 DIAGNOSIS — F17210 Nicotine dependence, cigarettes, uncomplicated: Secondary | ICD-10-CM | POA: Insufficient documentation

## 2017-08-16 DIAGNOSIS — Z113 Encounter for screening for infections with a predominantly sexual mode of transmission: Secondary | ICD-10-CM

## 2017-08-16 NOTE — Discharge Instructions (Signed)
Cytology sent, you will be contacted with any positive results that requires further treatment. Refrain from sexual activity for the next 7 days. Monitor for any worsening of symptoms, fever, abdominal pain, nausea, vomiting, testicular pain/swelling, penile lesion/sore/wound to follow up for reevaluation.

## 2017-08-16 NOTE — ED Triage Notes (Signed)
Pt states his girlfriend was here last week and tested positive for an STD, but he doesn't remember the name.  He denies any symptoms at this time.

## 2017-08-16 NOTE — ED Provider Notes (Signed)
[facility]    CSN: [id number] Arrival date & time: 08/16/17  1209     History   Chief Complaint Chief Complaint  Patient presents with  . Exposure to STD    HPI Erma Pintoric L Hansman is a 37 y.o. male.   37 year old male comes in for STD testing.  States partner told him she was tested positive for STD, but does not recall which one.  He is asymptomatic.  Denies abdominal pain, nausea, vomiting.  Denies fever, chills, night sweats.  Denies urinary symptoms such as frequency, dysuria, hematuria.  Denies penile discharge, penile lesion, testicular pain, testicular swelling.     History reviewed. No pertinent past medical history.  There are no active problems to display for this patient.   History reviewed. No pertinent surgical history.     Home Medications    Prior to Admission medications   Medication Sig Start Date End Date Taking? Authorizing Provider  albuterol (PROVENTIL HFA;VENTOLIN HFA) 108 (90 BASE) MCG/ACT inhaler Inhale 1-2 puffs into the lungs every 6 (six) hours as needed for wheezing or shortness of breath. Patient not taking: Reported on 12/26/2015 02/12/14   Terri PiedraForcucci, Courtney, PA-C  cyclobenzaprine (FLEXERIL) 10 MG tablet Take 1 tablet (10 mg total) by mouth at bedtime. 12/26/15   Elvina SidleLauenstein, Kurt, MD  famotidine (PEPCID) 20 MG tablet Take 1 tablet (20 mg total) by mouth 2 (two) times daily. 03/21/15   Patel-Mills, Lorelle FormosaHanna, PA-C  loratadine (CLARITIN) 10 MG tablet Take 1 tablet (10 mg total) by mouth daily. Patient not taking: Reported on 12/26/2015 02/12/14   Terri PiedraForcucci, Courtney, PA-C  meloxicam (MOBIC) 7.5 MG tablet Take 1 tablet (7.5 mg total) by mouth daily. 12/26/15   Elvina SidleLauenstein, Kurt, MD    Family History History reviewed. No pertinent family history.  Social History Social History   Tobacco Use  . Smoking status: Current Every Day Smoker    Packs/day: 1.00    Types: Cigarettes  . Smokeless tobacco: Never Used  Substance Use Topics  .  Alcohol use: Yes    Comment: socially   . Drug use: No     Allergies   Patient has no known allergies.   Review of Systems Review of Systems  Reason unable to perform ROS: See HPI as above.     Physical Exam Triage Vital Signs ED Triage Vitals  Enc Vitals Group     BP 08/16/17 1306 135/88     Pulse Rate 08/16/17 1306 94     Resp --      Temp 08/16/17 1306 98.3 F (36.8 C)     Temp Source 08/16/17 1306 Oral     SpO2 08/16/17 1306 100 %     Weight --      Height --      Head Circumference --      Peak Flow --      Pain Score 08/16/17 1304 0     Pain Loc --      Pain Edu? --      Excl. in GC? --    No data found.  Updated Vital Signs BP 135/88 (BP Location: Left Arm)   Pulse 94   Temp 98.3 F (36.8 C) (Oral)   SpO2 100%   Physical Exam  Constitutional: He is oriented to person, place, and time. He appears well-developed and well-nourished. No distress.  HENT:  Head: Normocephalic and atraumatic.  Eyes: Pupils are equal, round, and reactive to light. Conjunctivae are normal.  Neurological: He is  alert and oriented to person, place, and time.     UC Treatments / Results  Labs (all labs ordered are listed, but only abnormal results are displayed) Labs Reviewed  URINE CYTOLOGY ANCILLARY ONLY    EKG None  Radiology No results found.  Procedures Procedures (including critical care time)  Medications Ordered in UC Medications - No data to display  Initial Impression / Assessment and Plan / UC Course  I have reviewed the triage vital signs and the nursing notes.  Pertinent labs & imaging results that were available during my care of the patient were reviewed by me and considered in my medical decision making (see chart for details).    Patient would like cytology results prior to treatment. Cytology sent, patient will be contacted with any positive results that require additional treatment. Patient to refrain from sexual activity for the next 7  days. Return precautions given.   Final Clinical Impressions(s) / UC Diagnoses   Final diagnoses:  Exposure to STD    ED Prescriptions    None        Belinda Fisher, Cordelia Poche 08/16/17 2120

## 2017-08-18 LAB — URINE CYTOLOGY ANCILLARY ONLY
Chlamydia: NEGATIVE
Neisseria Gonorrhea: NEGATIVE
TRICH (WINDOWPATH): NEGATIVE

## 2017-10-15 ENCOUNTER — Emergency Department (HOSPITAL_COMMUNITY)
Admission: EM | Admit: 2017-10-15 | Discharge: 2017-10-15 | Disposition: A | Discharge status: Home / Self Care | Payer: Self-pay | Attending: Emergency Medicine | Admitting: Emergency Medicine

## 2017-10-15 ENCOUNTER — Ambulatory Visit (INDEPENDENT_AMBULATORY_CARE_PROVIDER_SITE_OTHER): Payer: Self-pay

## 2017-10-15 ENCOUNTER — Emergency Department (HOSPITAL_COMMUNITY): Payer: Self-pay

## 2017-10-15 ENCOUNTER — Ambulatory Visit (HOSPITAL_COMMUNITY)
Admission: EM | Admit: 2017-10-15 | Discharge: 2017-10-15 | Disposition: A | Discharge status: Home / Self Care | Payer: Self-pay | Attending: Internal Medicine | Admitting: Internal Medicine

## 2017-10-15 ENCOUNTER — Encounter (HOSPITAL_COMMUNITY): Payer: Self-pay | Admitting: *Deleted

## 2017-10-15 DIAGNOSIS — Y999 Unspecified external cause status: Secondary | ICD-10-CM | POA: Insufficient documentation

## 2017-10-15 DIAGNOSIS — Z23 Encounter for immunization: Secondary | ICD-10-CM

## 2017-10-15 DIAGNOSIS — S01511A Laceration without foreign body of lip, initial encounter: Secondary | ICD-10-CM

## 2017-10-15 DIAGNOSIS — M542 Cervicalgia: Secondary | ICD-10-CM

## 2017-10-15 DIAGNOSIS — S199XXA Unspecified injury of neck, initial encounter: Secondary | ICD-10-CM

## 2017-10-15 DIAGNOSIS — Y929 Unspecified place or not applicable: Secondary | ICD-10-CM | POA: Insufficient documentation

## 2017-10-15 DIAGNOSIS — Y9389 Activity, other specified: Secondary | ICD-10-CM | POA: Insufficient documentation

## 2017-10-15 DIAGNOSIS — F17210 Nicotine dependence, cigarettes, uncomplicated: Secondary | ICD-10-CM | POA: Insufficient documentation

## 2017-10-15 DIAGNOSIS — T71193A Asphyxiation due to mechanical threat to breathing due to other causes, assault, initial encounter: Secondary | ICD-10-CM

## 2017-10-15 DIAGNOSIS — S01511D Laceration without foreign body of lip, subsequent encounter: Secondary | ICD-10-CM

## 2017-10-15 LAB — I-STAT CREATININE, ED: Creatinine, Ser: 0.8 mg/dL (ref 0.61–1.24)

## 2017-10-15 MED ORDER — NAPROXEN 500 MG PO TABS: 500.0000 mg | ORAL_TABLET | Freq: Two times a day (BID) | ORAL | 0 refills | Status: DC

## 2017-10-15 MED ORDER — TETANUS-DIPHTH-ACELL PERTUSSIS 5-2.5-18.5 LF-MCG/0.5 IM SUSP
0.5000 mL | Freq: Once | INTRAMUSCULAR | Status: AC
  Administered 2017-10-15: 0.5 mL via INTRAMUSCULAR

## 2017-10-15 MED ORDER — TETANUS-DIPHTH-ACELL PERTUSSIS 5-2.5-18.5 LF-MCG/0.5 IM SUSP
INTRAMUSCULAR | Status: AC
  Filled 2017-10-15: qty 0.5

## 2017-10-15 MED ORDER — IOPAMIDOL (ISOVUE-370) INJECTION 76%
50.0000 mL | Freq: Once | INTRAVENOUS | Status: AC | PRN
  Administered 2017-10-15: 50 mL via INTRAVENOUS

## 2017-10-15 MED ORDER — OXYCODONE HCL 5 MG PO TABS
5.0000 mg | ORAL_TABLET | Freq: Once | ORAL | Status: AC
  Administered 2017-10-15: 5 mg via ORAL
  Filled 2017-10-15: qty 1

## 2017-10-15 MED ORDER — LIDOCAINE HCL 2 % IJ SOLN
INTRAMUSCULAR | Status: AC
  Filled 2017-10-15: qty 20

## 2017-10-15 NOTE — Discharge Instructions (Signed)
Cleanse mouth after every time you eat to ensure keep clean. Avoid touching of the upper lip to prevent infection.  Return in 3-5 days for suture removal.  Please go to ER now for further evaluation of your neck.
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
## 2017-10-15 NOTE — ED Notes (Signed)
ED First Nurse, Thayer Ohmhris, notified of pt.

## 2017-10-15 NOTE — ED Notes (Signed)
ED Provider at bedside. 

## 2017-10-15 NOTE — ED Notes (Signed)
Patient transported to CT 
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
## 2017-10-15 NOTE — ED Notes (Signed)
Patient transported to X-ray 

## 2017-10-20 ENCOUNTER — Encounter (HOSPITAL_COMMUNITY): Payer: Self-pay | Admitting: Emergency Medicine

## 2017-10-20 ENCOUNTER — Ambulatory Visit (HOSPITAL_COMMUNITY)
Admission: EM | Admit: 2017-10-20 | Discharge: 2017-10-20 | Disposition: A | Discharge status: Home / Self Care | Payer: Self-pay | Attending: Family Medicine | Admitting: Family Medicine

## 2017-10-20 DIAGNOSIS — Z4802 Encounter for removal of sutures: Secondary | ICD-10-CM

## 2017-10-20 DIAGNOSIS — S01511D Laceration without foreign body of lip, subsequent encounter: Secondary | ICD-10-CM

## 2017-10-20 NOTE — ED Triage Notes (Signed)
Pt here for suture removal to lip; per pt dissolvable sutures to inside of lip (none noted) and 2 sutures to outside of lip; scab noted; small area of scab removed; OK per YN; wound clean; no bleeding noted

## 2019-01-23 ENCOUNTER — Other Ambulatory Visit: Payer: Self-pay

## 2019-01-23 ENCOUNTER — Ambulatory Visit (HOSPITAL_COMMUNITY)
Admission: EM | Admit: 2019-01-23 | Discharge: 2019-01-23 | Disposition: A | Discharge status: Home / Self Care | Payer: BLUE CROSS/BLUE SHIELD | Attending: Urgent Care | Admitting: Urgent Care

## 2019-01-23 ENCOUNTER — Encounter (HOSPITAL_COMMUNITY): Payer: Self-pay

## 2019-01-23 DIAGNOSIS — K029 Dental caries, unspecified: Secondary | ICD-10-CM

## 2019-01-23 DIAGNOSIS — R030 Elevated blood-pressure reading, without diagnosis of hypertension: Secondary | ICD-10-CM

## 2019-01-23 DIAGNOSIS — K047 Periapical abscess without sinus: Secondary | ICD-10-CM

## 2019-01-23 DIAGNOSIS — K0889 Other specified disorders of teeth and supporting structures: Secondary | ICD-10-CM | POA: Diagnosis not present

## 2019-01-23 MED ORDER — NAPROXEN 500 MG PO TABS: 500.0000 mg | ORAL_TABLET | Freq: Two times a day (BID) | ORAL | 0 refills | Status: DC

## 2019-01-23 MED ORDER — AMOXICILLIN-POT CLAVULANATE 875-125 MG PO TABS: 1.0000 | ORAL_TABLET | Freq: Two times a day (BID) | ORAL | 0 refills | Status: DC

## 2019-01-23 NOTE — Discharge Instructions (Signed)
[facility] [phone] extension [phone]  [facility] Civils [phone] [address]  [facility] [phone] [address]  [facility] [phone] extension [phone] [place], [place], [address] First come first serve for the first 10 clients.  May do simple extractions only, no wisdom teeth or surgery.  You may try the second for Thursday of the month starting at 6:30 AM.  [employer] You may call the school to see if they are still helping to provide dental care for emergent cases.  

## 2019-01-23 NOTE — ED Triage Notes (Signed)
Patient presents to Urgent Care with complaints of left sided dental pain since a few days ago. Patient reports he has been taking ibuprofen for pain, no improvement, also thinks his gums are slightly swollen.

## 2019-01-23 NOTE — ED Provider Notes (Signed)
[place]   MRN: [id number] DOB: 23-Apr-1980  Subjective:   Donald Maldonado is a 38 y.o. male presenting for 3 to 4-day history of recurrent moderate to severe worsening left-sided upper dental pain.  Patient is now having difficulty even opening up his mouth.  He is still trying to eat and has to use the other side of his mouth.  Denies fever, facial swelling, sinus congestion, sore throat, cough, chest pain, shortness of breath.  He does not have any dental insurance currently.  When he was in the service, he did have extensive work done including a root canal, fillings, extractions.  Denies history of hypertension.  Has been using ibuprofen to help with the symptoms.  He is not currently taking any medications and has no known food or drug allergies.  Denies past medical and surgical history.   Family History  Problem Relation Age of Onset  . Healthy Mother   . Healthy Father     Social History   Tobacco Use  . Smoking status: Current Every Day Smoker    Packs/day: 1.00    Types: Cigarettes  . Smokeless tobacco: Never Used  Substance Use Topics  . Alcohol use: Yes    Comment: socially   . Drug use: Never    ROS   Objective:   Vitals: BP (!) 151/103 (BP Location: Right Arm)   Pulse 92   Temp 99.1 F (37.3 C) (Oral)   Resp 15   SpO2 100%   Physical Exam Constitutional:      General: He is not in acute distress.    Appearance: Normal appearance. He is well-developed and normal weight. He is not ill-appearing, toxic-appearing or diaphoretic.  HENT:     Head: Normocephalic and atraumatic.     Right Ear: External ear normal.     Left Ear: External ear normal.     Nose: Nose normal.     Mouth/Throat:     Mouth: Mucous membranes are moist.     Pharynx: Oropharynx is clear.   Eyes:     General: No scleral icterus.    Extraocular Movements: Extraocular movements intact.     Pupils: Pupils are equal, round, and reactive to light.  Cardiovascular:   Rate and Rhythm: Normal rate and regular rhythm.     Heart sounds: Normal heart sounds. No murmur. No friction rub. No gallop.   Pulmonary:     Effort: Pulmonary effort is normal. No respiratory distress.     Breath sounds: Normal breath sounds. No stridor. No wheezing, rhonchi or rales.  Neurological:     Mental Status: He is alert and oriented to person, place, and time.  Psychiatric:        Mood and Affect: Mood normal.        Behavior: Behavior normal.        Thought Content: Thought content normal.      Assessment and Plan :   1. Pain, dental   2. Dental infection   3. Pain due to dental caries   4. Dental caries   5. Elevated blood pressure reading in office without diagnosis of hypertension     Start Augmentin for dental abscess, use naproxen for pain and inflammation. Emphasized need for dental surgeon consult.  Monitor BP and recheck with PCP if it remains elevated.  Counseled patient on potential for adverse effects with medications prescribed/recommended today, strict ER and return-to-clinic precautions discussed, patient verbalized understanding.    Jaynee Eagles, [place] 01/23/19 1637

## 2019-03-05 ENCOUNTER — Ambulatory Visit: Payer: BLUE CROSS/BLUE SHIELD | Attending: Internal Medicine

## 2019-03-05 DIAGNOSIS — Z20822 Contact with and (suspected) exposure to covid-19: Secondary | ICD-10-CM

## 2019-03-07 LAB — NOVEL CORONAVIRUS, NAA: SARS-CoV-2, NAA: NOT DETECTED

## 2019-03-13 ENCOUNTER — Encounter (HOSPITAL_COMMUNITY): Payer: Self-pay

## 2019-03-13 ENCOUNTER — Ambulatory Visit (HOSPITAL_COMMUNITY)
Admission: EM | Admit: 2019-03-13 | Discharge: 2019-03-13 | Disposition: A | Discharge status: Home / Self Care | Payer: BLUE CROSS/BLUE SHIELD | Attending: Internal Medicine | Admitting: Internal Medicine

## 2019-03-13 ENCOUNTER — Other Ambulatory Visit: Payer: Self-pay

## 2019-03-13 DIAGNOSIS — J069 Acute upper respiratory infection, unspecified: Secondary | ICD-10-CM | POA: Diagnosis not present

## 2019-03-13 DIAGNOSIS — Z20822 Contact with and (suspected) exposure to covid-19: Secondary | ICD-10-CM

## 2019-03-13 DIAGNOSIS — U071 COVID-19: Secondary | ICD-10-CM | POA: Insufficient documentation
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
## 2019-03-13 NOTE — ED Triage Notes (Signed)
Pt c/o "flu symptoms", cough, body aches, fatigue, chills, nausea chest congestion x2 days. Denies HA,sore throat, abd pain, v/d, SOB, fever.  Exposure to person with COVID around 03/02/19; pt had negative COVID test 03/05/19 2/2 exposure but was asymptomatic at the time of testing.

## 2019-03-13 NOTE — Discharge Instructions (Addendum)
Take tylenol 325mg  tablet x 2 up to every 6 hours for sore throat, fever and body aches. Do not exceed 8 tablets in 24 hours   You can do over the counter cough drops as well    Go directly to the Emergency Department or call 911 if you have severe chest pain, shortness of breath, severe diarrhea or feel as though you might pass out.   If your Covid-19 test is positive, you will receive a phone call from [facility] regarding your results. Negative test results are not called. Both positive and negative results area always visible on MyChart. If you do not have a MyChart account, sign up instructions are in your discharge papers.   Persons who are directed to care for themselves at home may discontinue isolation under the following conditions:   At least 10 days have passed since symptom onset and  At least 24 hours have passed without running a fever (this means without the use of fever-reducing medications) and  Other symptoms have improved.  Persons infected with COVID-19 who never develop symptoms may discontinue isolation and other precautions 10 days after the date of their first positive COVID-19 test.

## 2019-03-14 LAB — NOVEL CORONAVIRUS, NAA (HOSP ORDER, SEND-OUT TO REF LAB; TAT 18-24 HRS): SARS-CoV-2, NAA: DETECTED — AB

## 2019-03-16 ENCOUNTER — Telehealth: Payer: Self-pay | Admitting: Emergency Medicine

## 2019-03-16 NOTE — Telephone Encounter (Signed)

## 2019-03-23 ENCOUNTER — Ambulatory Visit: Payer: BLUE CROSS/BLUE SHIELD | Attending: Internal Medicine

## 2019-03-23 DIAGNOSIS — Z20822 Contact with and (suspected) exposure to covid-19: Secondary | ICD-10-CM

## 2019-03-24 LAB — NOVEL CORONAVIRUS, NAA: SARS-CoV-2, NAA: NOT DETECTED

## 2019-09-25 ENCOUNTER — Encounter (HOSPITAL_COMMUNITY): Payer: Self-pay

## 2019-09-25 ENCOUNTER — Other Ambulatory Visit: Payer: Self-pay

## 2019-09-25 ENCOUNTER — Ambulatory Visit (HOSPITAL_COMMUNITY)
Admission: RE | Admit: 2019-09-25 | Discharge: 2019-09-25 | Disposition: A | Discharge status: Home / Self Care | Payer: BLUE CROSS/BLUE SHIELD | Source: Ambulatory Visit | Attending: Family Medicine | Admitting: Family Medicine

## 2019-09-25 VITALS — BP 114/84 | HR 75 | Temp 98.2°F | Resp 16

## 2019-09-25 DIAGNOSIS — J069 Acute upper respiratory infection, unspecified: Secondary | ICD-10-CM

## 2019-09-25 DIAGNOSIS — Z79899 Other long term (current) drug therapy: Secondary | ICD-10-CM | POA: Insufficient documentation

## 2019-09-25 DIAGNOSIS — F17210 Nicotine dependence, cigarettes, uncomplicated: Secondary | ICD-10-CM | POA: Diagnosis not present

## 2019-09-25 DIAGNOSIS — F17200 Nicotine dependence, unspecified, uncomplicated: Secondary | ICD-10-CM

## 2019-09-25 DIAGNOSIS — R0789 Other chest pain: Secondary | ICD-10-CM | POA: Insufficient documentation

## 2019-09-25 DIAGNOSIS — R05 Cough: Secondary | ICD-10-CM | POA: Diagnosis present

## 2019-09-25 DIAGNOSIS — Z791 Long term (current) use of non-steroidal anti-inflammatories (NSAID): Secondary | ICD-10-CM | POA: Insufficient documentation

## 2019-09-25 DIAGNOSIS — Z20822 Contact with and (suspected) exposure to covid-19: Secondary | ICD-10-CM | POA: Diagnosis not present

## 2019-09-25 MED ORDER — FLUTICASONE PROPIONATE 50 MCG/ACT NA SUSP: 1.0000 | Freq: Every day | NASAL | 0 refills | Status: DC

## 2019-09-25 MED ORDER — ALBUTEROL SULFATE HFA 108 (90 BASE) MCG/ACT IN AERS: 1.0000 | INHALATION_SPRAY | Freq: Four times a day (QID) | RESPIRATORY_TRACT | 0 refills | Status: DC | PRN

## 2019-09-25 MED ORDER — CETIRIZINE HCL 10 MG PO TABS: 10.0000 mg | ORAL_TABLET | Freq: Every day | ORAL | 0 refills | Status: DC

## 2019-09-25 MED ORDER — BENZONATATE 100 MG PO CAPS: 100.0000 mg | ORAL_CAPSULE | Freq: Three times a day (TID) | ORAL | 0 refills | Status: DC

## 2019-09-25 NOTE — Discharge Instructions (Signed)
Take medicines as prescribed Use inhaler as needed for coughing and chest tightness  Hydrate well  If not improving over 1 week, return, if acutely worsening with shortness of breath or high fever return or go to the Emergency Department  If your Covid-19 test is positive, you will receive a phone call from [facility] regarding your results. Negative test results are not called. Both positive and negative results area always visible on MyChart. If you do not have a MyChart account, sign up instructions are in your discharge papers.   Persons who are directed to care for themselves at home may discontinue isolation under the following conditions:   At least 10 days have passed since symptom onset and  At least 24 hours have passed without running a fever (this means without the use of fever-reducing medications) and  Other symptoms have improved.  Persons infected with COVID-19 who never develop symptoms may discontinue isolation and other precautions 10 days after the date of their first positive COVID-19 test.

## 2019-09-25 NOTE — ED Provider Notes (Signed)
[facility]    CSN: [id number] Arrival date & time: 09/25/19  1655      History   Chief Complaint Chief Complaint  Patient presents with  . Cough    HPI Donald Maldonado is a 39 y.o. male.   Patient presents for evaluation of 2 to 3-day history of cough, nasal congestion and chest tightness.  Reports he has had some clear sputum with cough.  Denies shortness of breath.  Denies fever or chills.  He reports congestion with clear nasal discharge.  Denies sore throat.  Denies headache.  No nausea or vomiting.  He does report some chest tightness from time to time especially with cough.  Denies change in taste or smell.  He does report he was Covid positive and this is verified by chart review in January 2021.  Patient reports he is a smoker and has had benefit from albuterol in the past when he has had respiratory illnesses.     History reviewed. No pertinent past medical history.  There are no problems to display for this patient.   History reviewed. No pertinent surgical history.     Home Medications    Prior to Admission medications   Medication Sig Start Date End Date Taking? Authorizing Provider  albuterol (VENTOLIN HFA) 108 (90 Base) MCG/ACT inhaler Inhale 1-2 puffs into the lungs every 6 (six) hours as needed for wheezing or shortness of breath. 09/25/19   Tazia Illescas, Veryl Speak, PA-C  amoxicillin-clavulanate (AUGMENTIN) 875-125 MG tablet Take 1 tablet by mouth every 12 (twelve) hours. 01/23/19   Wallis Bamberg, PA-C  benzonatate (TESSALON) 100 MG capsule Take 1 capsule (100 mg total) by mouth every 8 (eight) hours. 09/25/19   Terrence Pizana, Veryl Speak, PA-C  cetirizine (ZYRTEC ALLERGY) 10 MG tablet Take 1 tablet (10 mg total) by mouth daily. 09/25/19   Bryor Rami, Veryl Speak, PA-C  fluticasone (FLONASE) 50 MCG/ACT nasal spray Place 1 spray into both nostrils daily. 09/25/19   Jyl Chico, Veryl Speak, PA-C  naproxen (NAPROSYN) 500 MG tablet Take 1 tablet (500 mg total) by mouth 2 (two) times daily with a  meal. 01/23/19   Wallis Bamberg, PA-C    Family History Family History  Problem Relation Age of Onset  . Healthy Mother   . Healthy Father     Social History Social History   Tobacco Use  . Smoking status: Current Every Day Smoker    Packs/day: 1.00    Types: Cigarettes  . Smokeless tobacco: Never Used  Vaping Use  . Vaping Use: Never used  Substance Use Topics  . Alcohol use: Yes    Comment: socially   . Drug use: Never     Allergies   Patient has no known allergies.   Review of Systems Review of Systems   Physical Exam Triage Vital Signs ED Triage Vitals  Enc Vitals Group     BP 09/25/19 1808 114/84     Pulse Rate 09/25/19 1808 75     Resp 09/25/19 1808 16     Temp 09/25/19 1808 98.2 F (36.8 C)     Temp Source 09/25/19 1808 Oral     SpO2 09/25/19 1808 99 %     Weight --      Height --      Head Circumference --      Peak Flow --      Pain Score 09/25/19 1806 0     Pain Loc --      Pain Edu? --  Excl. in GC? --    No data found.  Updated Vital Signs BP 114/84   Pulse 75   Temp 98.2 F (36.8 C) (Oral)   Resp 16   SpO2 99%   Visual Acuity Right Eye Distance:   Left Eye Distance:   Bilateral Distance:    Right Eye Near:   Left Eye Near:    Bilateral Near:     Physical Exam Vitals and nursing note reviewed.  Constitutional:      General: He is not in acute distress.    Appearance: Normal appearance. He is well-developed. He is not ill-appearing.  HENT:     Head: Normocephalic and atraumatic.     Nose: Congestion and rhinorrhea present.     Mouth/Throat:     Mouth: Mucous membranes are moist.     Comments: Mild postnasal drip Eyes:     Conjunctiva/sclera: Conjunctivae normal.  Cardiovascular:     Rate and Rhythm: Normal rate and regular rhythm.     Heart sounds: No murmur heard.   Pulmonary:     Effort: Pulmonary effort is normal. No respiratory distress.     Breath sounds: Normal breath sounds. No wheezing, rhonchi or rales.    Abdominal:     Palpations: Abdomen is soft.     Tenderness: There is no abdominal tenderness.  Musculoskeletal:     Cervical back: Neck supple.  Skin:    General: Skin is warm and dry.  Neurological:     Mental Status: He is alert.      UC Treatments / Results  Labs (all labs ordered are listed, but only abnormal results are displayed) Labs Reviewed  SARS CORONAVIRUS 2 (TAT 6-24 HRS)    EKG   Radiology No results found.  Procedures Procedures (including critical care time)  Medications Ordered in UC Medications - No data to display  Initial Impression / Assessment and Plan / UC Course  I have reviewed the triage vital signs and the nursing notes.  Pertinent labs & imaging results that were available during my care of the patient were reviewed by me and considered in my medical decision making (see chart for details).     #Viral URI with cough #Smoker Patient is a 39 year old presenting with viral URI with cough.  Covid positive in January verified via chart review.  Well-appearing and afebrile here.  Reassuring lung exam, saturating 99%.  Given response in the past with albuterol with chest tightness during respiratory illnesses, will send albuterol and otherwise treat him symptomatically today.  We will send Covid PCR.  Discussed return follow-up precautions.  Patient verbalized understanding plan of care. Final Clinical Impressions(s) / UC Diagnoses   Final diagnoses:  Viral URI with cough  Smoker     Discharge Instructions     Take medicines as prescribed Use inhaler as needed for coughing and chest tightness  Hydrate well  If not improving over 1 week, return, if acutely worsening with shortness of breath or high fever return or go to the Emergency Department  If your Covid-19 test is positive, you will receive a phone call from [facility] regarding your results. Negative test results are not called. Both positive and negative results area always  visible on MyChart. If you do not have a MyChart account, sign up instructions are in your discharge papers.   Persons who are directed to care for themselves at home may discontinue isolation under the following conditions:  . At least 10 days have passed since  symptom onset and . At least 24 hours have passed without running a fever (this means without the use of fever-reducing medications) and . Other symptoms have improved.  Persons infected with COVID-19 who never develop symptoms may discontinue isolation and other precautions 10 days after the date of their first positive COVID-19 test.       ED Prescriptions    Medication Sig Dispense Auth. Provider   benzonatate (TESSALON) 100 MG capsule Take 1 capsule (100 mg total) by mouth every 8 (eight) hours. 21 capsule Veleta Yamamoto, Veryl Speak, PA-C   albuterol (VENTOLIN HFA) 108 (90 Base) MCG/ACT inhaler Inhale 1-2 puffs into the lungs every 6 (six) hours as needed for wheezing or shortness of breath. 18 g Evette Diclemente, Veryl Speak, PA-C   fluticasone (FLONASE) 50 MCG/ACT nasal spray Place 1 spray into both nostrils daily. 15.8 mL Amyiah Gaba, Veryl Speak, PA-C   cetirizine (ZYRTEC ALLERGY) 10 MG tablet Take 1 tablet (10 mg total) by mouth daily. 30 tablet Adiya Selmer, Veryl Speak, PA-C     PDMP not reviewed this encounter.   Hermelinda Medicus, PA-C 09/26/19 0003

## 2019-09-25 NOTE — ED Triage Notes (Signed)
PT reports cough, congestion, and some chest tightness with cough. Started 2 days ago.

## 2019-09-26 LAB — SARS CORONAVIRUS 2 (TAT 6-24 HRS): SARS Coronavirus 2: NEGATIVE

## 2019-11-21 ENCOUNTER — Ambulatory Visit (HOSPITAL_COMMUNITY)
Admission: EM | Admit: 2019-11-21 | Discharge: 2019-11-21 | Disposition: A | Discharge status: Home / Self Care | Payer: BLUE CROSS/BLUE SHIELD | Attending: Family Medicine | Admitting: Family Medicine

## 2019-11-21 ENCOUNTER — Encounter (HOSPITAL_COMMUNITY): Payer: Self-pay

## 2019-11-21 ENCOUNTER — Other Ambulatory Visit: Payer: Self-pay

## 2019-11-21 DIAGNOSIS — Z20822 Contact with and (suspected) exposure to covid-19: Secondary | ICD-10-CM | POA: Diagnosis not present

## 2019-11-21 DIAGNOSIS — Z791 Long term (current) use of non-steroidal anti-inflammatories (NSAID): Secondary | ICD-10-CM | POA: Insufficient documentation

## 2019-11-21 DIAGNOSIS — F17210 Nicotine dependence, cigarettes, uncomplicated: Secondary | ICD-10-CM | POA: Insufficient documentation

## 2019-11-21 DIAGNOSIS — R05 Cough: Secondary | ICD-10-CM

## 2019-11-21 DIAGNOSIS — Z79899 Other long term (current) drug therapy: Secondary | ICD-10-CM | POA: Insufficient documentation

## 2019-11-21 DIAGNOSIS — R059 Cough, unspecified: Secondary | ICD-10-CM

## 2019-11-21 MED ORDER — PROMETHAZINE-DM 6.25-15 MG/5ML PO SYRP: 5.0000 mL | ORAL_SOLUTION | Freq: Four times a day (QID) | ORAL | 0 refills | Status: DC | PRN

## 2019-11-21 MED ORDER — PREDNISONE 20 MG PO TABS: 40.0000 mg | ORAL_TABLET | Freq: Every day | ORAL | 0 refills | Status: DC

## 2019-11-21 MED ORDER — ALBUTEROL SULFATE HFA 108 (90 BASE) MCG/ACT IN AERS
1.0000 | INHALATION_SPRAY | Freq: Four times a day (QID) | RESPIRATORY_TRACT | 0 refills | Status: DC | PRN
Start: 2019-11-21 — End: 2020-07-12

## 2019-11-21 MED ORDER — BUDESONIDE-FORMOTEROL FUMARATE 160-4.5 MCG/ACT IN AERO: 2.0000 | INHALATION_SPRAY | Freq: Two times a day (BID) | RESPIRATORY_TRACT | 3 refills | Status: DC

## 2019-11-21 NOTE — ED Provider Notes (Signed)
[facility]    CSN: [id number] Arrival date & time: 11/21/19  1513      History   Chief Complaint Chief Complaint  Patient presents with  . Cough    HPI Donald Maldonado is a 39 y.o. male.   Presenting today with dry cough that gets worse at night when he's laying down to sleep the past few months. Denies wheezing, fevers, SOB aside from during coughing fits, CP, congestion, sore throat. Did have a close exposure to a COVID positive patient so does want to get tested even though sxs unchanged x 2 months. Tried some theraflu without much relief, and 2 months ago was seen for this and given albuterol and allergy medications which he states did not resolve issue. Has smoked cigarettes since age 89, heavily recently per patient. Does not have a PCP.      History reviewed. No pertinent past medical history.  There are no problems to display for this patient.   History reviewed. No pertinent surgical history.     Home Medications    Prior to Admission medications   Medication Sig Start Date End Date Taking? Authorizing Provider  albuterol (VENTOLIN HFA) 108 (90 Base) MCG/ACT inhaler Inhale 1-2 puffs into the lungs every 6 (six) hours as needed for wheezing or shortness of breath. 11/21/19   Particia Nearing, PA-C  amoxicillin-clavulanate (AUGMENTIN) 875-125 MG tablet Take 1 tablet by mouth every 12 (twelve) hours. 01/23/19   Wallis Bamberg, PA-C  benzonatate (TESSALON) 100 MG capsule Take 1 capsule (100 mg total) by mouth every 8 (eight) hours. 09/25/19   Darr, Veryl Speak, PA-C  budesonide-formoterol (SYMBICORT) 160-4.5 MCG/ACT inhaler Inhale 2 puffs into the lungs 2 (two) times daily. 11/21/19   Particia Nearing, PA-C  cetirizine (ZYRTEC ALLERGY) 10 MG tablet Take 1 tablet (10 mg total) by mouth daily. 09/25/19   Darr, Veryl Speak, PA-C  fluticasone (FLONASE) 50 MCG/ACT nasal spray Place 1 spray into both nostrils daily. 09/25/19   Darr, Veryl Speak, PA-C  naproxen (NAPROSYN)  500 MG tablet Take 1 tablet (500 mg total) by mouth 2 (two) times daily with a meal. 01/23/19   Wallis Bamberg, PA-C  predniSONE (DELTASONE) 20 MG tablet Take 2 tablets (40 mg total) by mouth daily with breakfast. 11/21/19   Particia Nearing, PA-C  promethazine-dextromethorphan (PROMETHAZINE-DM) 6.25-15 MG/5ML syrup Take 5 mLs by mouth 4 (four) times daily as needed for cough. 11/21/19   Particia Nearing, PA-C    Family History Family History  Problem Relation Age of Onset  . Healthy Mother   . Healthy Father     Social History Social History   Tobacco Use  . Smoking status: Current Every Day Smoker    Packs/day: 1.00    Types: Cigarettes  . Smokeless tobacco: Never Used  Vaping Use  . Vaping Use: Never used  Substance Use Topics  . Alcohol use: Yes    Comment: socially   . Drug use: Never     Allergies   Patient has no known allergies.   Review of Systems Review of Systems PER HPI    Physical Exam Triage Vital Signs ED Triage Vitals  Enc Vitals Group     BP 11/21/19 1746 (!) 138/100     Pulse Rate 11/21/19 1746 80     Resp 11/21/19 1746 17     Temp 11/21/19 1746 98.7 F (37.1 C)     Temp Source 11/21/19 1746 Oral     SpO2  11/21/19 1746 100 %     Weight --      Height --      Head Circumference --      Peak Flow --      Pain Score 11/21/19 1744 0     Pain Loc --      Pain Edu? --      Excl. in GC? --    No data found.  Updated Vital Signs BP (!) 138/100 (BP Location: Right Arm)   Pulse 80   Temp 98.7 F (37.1 C) (Oral)   Resp 17   SpO2 100%   Visual Acuity Right Eye Distance:   Left Eye Distance:   Bilateral Distance:    Right Eye Near:   Left Eye Near:    Bilateral Near:     Physical Exam Vitals and nursing note reviewed.  Constitutional:      Appearance: Normal appearance.  HENT:     Head: Atraumatic.     Right Ear: Tympanic membrane normal.     Left Ear: Tympanic membrane normal.     Nose: Nose normal.     Mouth/Throat:      Mouth: Mucous membranes are moist.     Pharynx: Oropharynx is clear.  Eyes:     Extraocular Movements: Extraocular movements intact.     Conjunctiva/sclera: Conjunctivae normal.  Cardiovascular:     Rate and Rhythm: Normal rate and regular rhythm.  Pulmonary:     Effort: Pulmonary effort is normal.     Breath sounds: Normal breath sounds. No wheezing or rales.  Abdominal:     General: Bowel sounds are normal.     Palpations: Abdomen is soft.  Musculoskeletal:        General: Normal range of motion.     Cervical back: Normal range of motion and neck supple.  Skin:    General: Skin is warm and dry.  Neurological:     General: No focal deficit present.     Mental Status: He is oriented to person, place, and time.  Psychiatric:        Mood and Affect: Mood normal.        Thought Content: Thought content normal.        Judgment: Judgment normal.      UC Treatments / Results  Labs (all labs ordered are listed, but only abnormal results are displayed) Labs Reviewed  SARS CORONAVIRUS 2 (TAT 6-24 HRS)    EKG   Radiology No results found.  Procedures Procedures (including critical care time)  Medications Ordered in UC Medications - No data to display  Initial Impression / Assessment and Plan / UC Course  I have reviewed the triage vital signs and the nursing notes.  Pertinent labs & imaging results that were available during my care of the patient were reviewed by me and considered in my medical decision making (see chart for details).     Given duration and sxs, suspect some underlying COPD vs asthma. Will start symbicort and restart albuterol prn. Prednisone burst ordered as well. Phenergan DM for nighttime cough suppression. Work on smoking cessation. F/u with new PCP as soon as able. Return if sxs worsening.    Final Clinical Impressions(s) / UC Diagnoses   Final diagnoses:  Cough   Discharge Instructions   None    ED Prescriptions    Medication Sig  Dispense Auth. Provider   albuterol (VENTOLIN HFA) 108 (90 Base) MCG/ACT inhaler Inhale 1-2 puffs into the lungs every 6 (  six) hours as needed for wheezing or shortness of breath. 18 g Particia Nearing, PA-C   budesonide-formoterol [facility]) 160-4.5 MCG/ACT inhaler Inhale 2 puffs into the lungs 2 (two) times daily. 1 each Particia Nearing, PA-C   promethazine-dextromethorphan (PROMETHAZINE-DM) 6.25-15 MG/5ML syrup Take 5 mLs by mouth 4 (four) times daily as needed for cough. 118 mL Particia Nearing, PA-C   predniSONE (DELTASONE) 20 MG tablet Take 2 tablets (40 mg total) by mouth daily with breakfast. 10 tablet Particia Nearing, [place]     PDMP not reviewed this encounter.   Particia Nearing, [place] 11/21/19 [phone]

## 2019-11-21 NOTE — ED Triage Notes (Signed)
Pt presents with ongoing viral cough for past 2 months that is unrelieved with prescribed medication; pt had negative covid swab about a month ago.

## 2019-11-22 LAB — SARS CORONAVIRUS 2 (TAT 6-24 HRS): SARS Coronavirus 2: NEGATIVE

## 2020-04-11 ENCOUNTER — Other Ambulatory Visit: Payer: Self-pay

## 2020-04-11 ENCOUNTER — Ambulatory Visit
Admission: EM | Admit: 2020-04-11 | Discharge: 2020-04-11 | Disposition: A | Discharge status: Home / Self Care | Payer: BLUE CROSS/BLUE SHIELD | Attending: Emergency Medicine | Admitting: Emergency Medicine

## 2020-04-11 DIAGNOSIS — R52 Pain, unspecified: Secondary | ICD-10-CM | POA: Diagnosis not present

## 2020-04-11 DIAGNOSIS — R059 Cough, unspecified: Secondary | ICD-10-CM

## 2020-04-11 MED ORDER — BENZONATATE 200 MG PO CAPS
200.0000 mg | ORAL_CAPSULE | Freq: Three times a day (TID) | ORAL | 0 refills | Status: AC | PRN
Start: 2020-04-11 — End: 2020-04-18

## 2020-04-11 MED ORDER — IBUPROFEN 800 MG PO TABS: 800.0000 mg | ORAL_TABLET | Freq: Three times a day (TID) | ORAL | 0 refills | Status: DC

## 2020-04-11 NOTE — ED Triage Notes (Signed)
Pt presents with fatigue, generalized body aches, and non productive cough since last night.

## 2020-04-11 NOTE — Discharge Instructions (Signed)
Covid test pending, monitor MyChart for results Tylenol and ibuprofen for any fevers, headaches, body aches May use Tessalon for cough Use over-the-counter Zyrtec/cetirizine or loratadine/Claritin for postnasal drainage, may also try Mucinex Rest and fluids Follow-up if not improving or worsening

## 2020-04-11 NOTE — ED Provider Notes (Signed)
[name]    CSN: [id number] Arrival date & time: 04/11/20  1344      History   Chief Complaint Chief Complaint  Patient presents with  . Fatigue  . Generalized Body Aches  . Cough    HPI Donald Maldonado is a 40 y.o. male presenting today for evaluation of body aches and fatigue.  Reports symptoms began last night.  Mild occasional nonproductive cough.  Mild congestion.  Denies sore throat.  Reports possible Covid exposure a few weeks ago.  Denies fevers.  HPI  History reviewed. No pertinent past medical history.  There are no problems to display for this patient.   History reviewed. No pertinent surgical history.     Home Medications    Prior to Admission medications   Medication Sig Start Date End Date Taking? Authorizing Provider  benzonatate (TESSALON) 200 MG capsule Take 1 capsule (200 mg total) by mouth 3 (three) times daily as needed for up to 7 days for cough. 04/11/20 04/18/20 Yes Lavern Maslow C, PA-C  ibuprofen (ADVIL) 800 MG tablet Take 1 tablet (800 mg total) by mouth 3 (three) times daily. 04/11/20  Yes Jackelyn Illingworth C, PA-C  albuterol (VENTOLIN HFA) 108 (90 Base) MCG/ACT inhaler Inhale 1-2 puffs into the lungs every 6 (six) hours as needed for wheezing or shortness of breath. 11/21/19   Particia Nearing, PA-C  budesonide-formoterol [facility]) 160-4.5 MCG/ACT inhaler Inhale 2 puffs into the lungs 2 (two) times daily. 11/21/19   Particia Nearing, PA-C  cetirizine (ZYRTEC ALLERGY) 10 MG tablet Take 1 tablet (10 mg total) by mouth daily. 09/25/19   Darr, Gerilyn Pilgrim, PA-C  fluticasone (FLONASE) 50 MCG/ACT nasal spray Place 1 spray into both nostrils daily. 09/25/19   Darr, Gerilyn Pilgrim, PA-C  naproxen (NAPROSYN) 500 MG tablet Take 1 tablet (500 mg total) by mouth 2 (two) times daily with a meal. 01/23/19   Wallis Bamberg, PA-C  predniSONE (DELTASONE) 20 MG tablet Take 2 tablets (40 mg total) by mouth daily with breakfast. 11/21/19   Particia Nearing,  PA-C  promethazine-dextromethorphan (PROMETHAZINE-DM) 6.25-15 MG/5ML syrup Take 5 mLs by mouth 4 (four) times daily as needed for cough. 11/21/19   Particia Nearing, PA-C    Family History Family History  Problem Relation Age of Onset  . Healthy Mother   . Healthy Father     Social History Social History   Tobacco Use  . Smoking status: Current Every Day Smoker    Packs/day: 1.00    Types: Cigarettes  . Smokeless tobacco: Never Used  Vaping Use  . Vaping Use: Never used  Substance Use Topics  . Alcohol use: Yes    Comment: socially   . Drug use: Never     Allergies   Patient has no known allergies.   Review of Systems Review of Systems  Constitutional: Positive for fatigue. Negative for activity change, appetite change, chills and fever.  HENT: Negative for congestion, ear pain, rhinorrhea, sinus pressure, sore throat and trouble swallowing.   Eyes: Negative for discharge and redness.  Respiratory: Positive for cough. Negative for chest tightness and shortness of breath.   Cardiovascular: Negative for chest pain.  Gastrointestinal: Negative for abdominal pain, diarrhea, nausea and vomiting.  Musculoskeletal: Positive for myalgias.  Skin: Negative for rash.  Neurological: Negative for dizziness, light-headedness and headaches.     Physical Exam Triage Vital Signs ED Triage Vitals  Enc Vitals Group     BP      Pulse  Resp      Temp      Temp src      SpO2      Weight      Height      Head Circumference      Peak Flow      Pain Score      Pain Loc      Pain Edu?      Excl. in GC?    No data found.  Updated Vital Signs BP (!) 126/92 (BP Location: Right Arm)   Pulse 87   Temp 98.3 F (36.8 C) (Oral)   Resp 17   SpO2 97%   Visual Acuity Right Eye Distance:   Left Eye Distance:   Bilateral Distance:    Right Eye Near:   Left Eye Near:    Bilateral Near:     Physical Exam Vitals and nursing note reviewed.  Constitutional:       Appearance: He is well-developed and well-nourished.     Comments: No acute distress  HENT:     Head: Normocephalic and atraumatic.     Nose: Nose normal.     Mouth/Throat:     Comments: Oral mucosa pink and moist, no tonsillar enlargement or exudate. Posterior pharynx patent and nonerythematous, no uvula deviation or swelling. Normal phonation. Eyes:     Conjunctiva/sclera: Conjunctivae normal.  Cardiovascular:     Rate and Rhythm: Normal rate.  Pulmonary:     Effort: Pulmonary effort is normal. No respiratory distress.     Comments: Breathing comfortably at rest, CTABL, no wheezing, rales or other adventitious sounds auscultated Abdominal:     General: There is no distension.  Musculoskeletal:        General: Normal range of motion.     Cervical back: Neck supple.  Skin:    General: Skin is warm and dry.  Neurological:     Mental Status: He is alert and oriented to person, place, and time.  Psychiatric:        Mood and Affect: Mood and affect normal.      UC Treatments / Results  Labs (all labs ordered are listed, but only abnormal results are displayed) Labs Reviewed  NOVEL CORONAVIRUS, NAA    EKG   Radiology No results found.  Procedures Procedures (including critical care time)  Medications Ordered in UC Medications - No data to display  Initial Impression / Assessment and Plan / UC Course  I have reviewed the triage vital signs and the nursing notes.  Pertinent labs & imaging results that were available during my care of the patient were reviewed by me and considered in my medical decision making (see chart for details).     Viral URI with cough-Covid test pending, recommending symptomatic and supportive care, lungs clear, exam reassuring. Discussed strict return precautions. Patient verbalized understanding and is agreeable with plan.  Final Clinical Impressions(s) / UC Diagnoses   Final diagnoses:  Generalized body aches  Cough     Discharge  Instructions     Covid test pending, monitor MyChart for results Tylenol and ibuprofen for any fevers, headaches, body aches May use Tessalon for cough Use over-the-counter Zyrtec/cetirizine or loratadine/Claritin for postnasal drainage, may also try Mucinex Rest and fluids Follow-up if not improving or worsening    ED Prescriptions    Medication Sig Dispense Auth. Provider   benzonatate (TESSALON) 200 MG capsule Take 1 capsule (200 mg total) by mouth 3 (three) times daily as needed for up to  7 days for cough. 28 capsule Barret Esquivel C, PA-C   ibuprofen (ADVIL) 800 MG tablet Take 1 tablet (800 mg total) by mouth 3 (three) times daily. 21 tablet Elige Shouse, [place] C, PA-C     PDMP not reviewed this encounter.   Lew Dawes, [place] 04/11/20 1539

## 2020-04-12 LAB — SARS-COV-2, NAA 2 DAY TAT

## 2020-04-12 LAB — NOVEL CORONAVIRUS, NAA: SARS-CoV-2, NAA: NOT DETECTED

## 2020-06-02 ENCOUNTER — Other Ambulatory Visit: Payer: Self-pay

## 2020-06-02 ENCOUNTER — Ambulatory Visit
Admission: EM | Admit: 2020-06-02 | Discharge: 2020-06-02 | Disposition: A | Discharge status: Home / Self Care | Payer: BLUE CROSS/BLUE SHIELD | Attending: Emergency Medicine | Admitting: Emergency Medicine

## 2020-06-02 DIAGNOSIS — H01004 Unspecified blepharitis left upper eyelid: Secondary | ICD-10-CM | POA: Diagnosis not present

## 2020-06-02 HISTORY — DX: Other allergy status, other than to drugs and biological substances: Z91.09

## 2020-06-02 MED ORDER — ERYTHROMYCIN 5 MG/GM OP OINT: 1.0000 "application " | TOPICAL_OINTMENT | Freq: Four times a day (QID) | OPHTHALMIC | 0 refills | Status: AC

## 2020-06-02 NOTE — Discharge Instructions (Signed)
Cleanse lash line with gentle soap and water and washcloth at least daily Warm compresses to the area.  1 week of topical antibiotic gel.  Follow up with your eye doctor if no improvement or if worsening.

## 2020-06-02 NOTE — ED Triage Notes (Signed)
Pt presents to Urgent Care with c/o L eye pain (upper eyelid) x several months. Also reports occasional drainage and twitching. Denies visual disturbance.

## 2020-06-02 NOTE — ED Provider Notes (Signed)
[name]    CSN: [id number] Arrival date & time: 06/02/20  1208      History   Chief Complaint Chief Complaint  Patient presents with  . Eye Problem    HPI Donald Maldonado is a 40 y.o. male.   Donald Maldonado presents with complaints of left upper eye lid pain and swelling. Has been intermittent for nearly a year now but worse over the past few days. Has tried some warm compresses which haven't helped. No eye ball pain. No fevers. Doesn't wear contacts. No mattering or eye discharge.    ROS per HPI, negative if not otherwise mentioned.      Past Medical History:  Diagnosis Date  . Environmental allergies     There are no problems to display for this patient.   History reviewed. No pertinent surgical history.     Home Medications    Prior to Admission medications   Medication Sig Start Date End Date Taking? Authorizing Provider  erythromycin ophthalmic ointment Place 1 application into the left eye 4 (four) times daily for 7 days. 06/02/20 06/09/20 Yes Shaden Higley, Barron Alvine, NP  albuterol (VENTOLIN HFA) 108 (90 Base) MCG/ACT inhaler Inhale 1-2 puffs into the lungs every 6 (six) hours as needed for wheezing or shortness of breath. 11/21/19   Particia Nearing, PA-C  budesonide-formoterol [facility]) 160-4.5 MCG/ACT inhaler Inhale 2 puffs into the lungs 2 (two) times daily. 11/21/19   Particia Nearing, PA-C  cetirizine (ZYRTEC ALLERGY) 10 MG tablet Take 1 tablet (10 mg total) by mouth daily. 09/25/19   Darr, Gerilyn Pilgrim, PA-C  fluticasone (FLONASE) 50 MCG/ACT nasal spray Place 1 spray into both nostrils daily. 09/25/19   Darr, Gerilyn Pilgrim, PA-C  ibuprofen (ADVIL) 800 MG tablet Take 1 tablet (800 mg total) by mouth 3 (three) times daily. 04/11/20   Wieters, Hallie C, PA-C  naproxen (NAPROSYN) 500 MG tablet Take 1 tablet (500 mg total) by mouth 2 (two) times daily with a meal. 01/23/19   Wallis Bamberg, PA-C  predniSONE (DELTASONE) 20 MG tablet Take 2 tablets (40 mg total)  by mouth daily with breakfast. 11/21/19   Particia Nearing, PA-C  promethazine-dextromethorphan (PROMETHAZINE-DM) 6.25-15 MG/5ML syrup Take 5 mLs by mouth 4 (four) times daily as needed for cough. 11/21/19   Particia Nearing, PA-C    Family History Family History  Problem Relation Age of Onset  . Healthy Mother   . Healthy Father     Social History Social History   Tobacco Use  . Smoking status: Current Every Day Smoker    Packs/day: 1.00    Types: Cigarettes  . Smokeless tobacco: Never Used  Vaping Use  . Vaping Use: Never used  Substance Use Topics  . Alcohol use: Yes    Comment: socially   . Drug use: Never     Allergies   Patient has no known allergies.   Review of Systems Review of Systems   Physical Exam Triage Vital Signs ED Triage Vitals  Enc Vitals Group     BP 06/02/20 1254 123/84     Pulse Rate 06/02/20 1254 (!) 103     Resp 06/02/20 1254 20     Temp 06/02/20 1254 98.2 F (36.8 C)     Temp Source 06/02/20 1254 Oral     SpO2 06/02/20 1254 96 %     Weight 06/02/20 1250 130 lb (59 kg)     Height 06/02/20 1250 5\' 7"  (1.702 m)  Head Circumference --      Peak Flow --      Pain Score 06/02/20 1249 7     Pain Loc --      Pain Edu? --      Excl. in GC? --    No data found.  Updated Vital Signs BP 123/84 (BP Location: Left Arm)   Pulse (!) 103   Temp 98.2 F (36.8 C) (Oral)   Resp 20   Ht 5\' 7"  (1.702 m)   Wt 130 lb (59 kg)   SpO2 96%   BMI 20.36 kg/m   Visual Acuity Right Eye Distance:   Left Eye Distance:   Bilateral Distance:    Right Eye Near:   Left Eye Near:    Bilateral Near:     Physical Exam Constitutional:      Appearance: He is well-developed.  Eyes:     Extraocular Movements: Extraocular movements intact.     Conjunctiva/sclera: Conjunctivae normal.     Comments: Left upper eye lid with swelling at lash line; no drainage; no visible hordeolum   Cardiovascular:     Rate and Rhythm: Normal rate.   Pulmonary:     Effort: Pulmonary effort is normal.  Skin:    General: Skin is warm and dry.  Neurological:     Mental Status: He is alert and oriented to person, place, and time.      UC Treatments / Results  Labs (all labs ordered are listed, but only abnormal results are displayed) Labs Reviewed - No data to display  EKG   Radiology No results found.  Procedures Procedures (including critical care time)  Medications Ordered in UC Medications - No data to display  Initial Impression / Assessment and Plan / UC Course  I have reviewed the triage vital signs and the nursing notes.  Pertinent labs & imaging results that were available during my care of the patient were reviewed by me and considered in my medical decision making (see chart for details).     Findings consistent with blepharitis with topical erythromycin provided. Follow up recommendations provided. Patient verbalized understanding and agreeable to plan.   Final Clinical Impressions(s) / UC Diagnoses   Final diagnoses:  Blepharitis of left upper eyelid, unspecified type     Discharge Instructions     Cleanse lash line with gentle soap and water and washcloth at least daily Warm compresses to the area.  1 week of topical antibiotic gel.  Follow up with your eye doctor if no improvement or if worsening.    ED Prescriptions    Medication Sig Dispense Auth. Provider   erythromycin ophthalmic ointment Place 1 application into the left eye 4 (four) times daily for 7 days. 28 g , NP     PDMP not reviewed this encounter.   Georgetta Haber, NP 06/02/20 1601

## 2020-07-03 ENCOUNTER — Other Ambulatory Visit: Payer: Self-pay

## 2020-07-03 ENCOUNTER — Ambulatory Visit (HOSPITAL_COMMUNITY)
Admission: EM | Admit: 2020-07-03 | Discharge: 2020-07-03 | Disposition: A | Discharge status: Home / Self Care | Payer: BLUE CROSS/BLUE SHIELD | Attending: Physician Assistant | Admitting: Physician Assistant

## 2020-07-03 ENCOUNTER — Encounter (HOSPITAL_COMMUNITY): Payer: Self-pay | Admitting: Physician Assistant

## 2020-07-03 DIAGNOSIS — M62838 Other muscle spasm: Secondary | ICD-10-CM

## 2020-07-03 DIAGNOSIS — M545 Low back pain, unspecified: Secondary | ICD-10-CM

## 2020-07-03 MED ORDER — METHOCARBAMOL 500 MG PO TABS: 500.0000 mg | ORAL_TABLET | Freq: Three times a day (TID) | ORAL | 0 refills | Status: AC

## 2020-07-03 MED ORDER — DICLOFENAC SODIUM 75 MG PO TBEC: 75.0000 mg | DELAYED_RELEASE_TABLET | Freq: Two times a day (BID) | ORAL | 0 refills | Status: DC

## 2020-07-03 NOTE — ED Triage Notes (Signed)
Pt here for constant back pain onset 4 days... pain increases w/activity  Reports he constantly is lifting small objects at work  Denies f/v/n/d  A&O x4... NAD.Marland Kitchen. ambulatory

## 2020-07-03 NOTE — Discharge Instructions (Addendum)
Take diclofenac twice daily with food.  You should not take additional NSAIDs including aspirin, ibuprofen/Advil, naproxen/Aleve with this medication due to risk of GI bleeding.  Take Robaxin up to 3 times a day.  This will make you sleepy so do not drive or drink alcohol with this.  You should use heat, rest, stretch for additional symptom relief.  Follow-up with PCP to consider imaging if symptoms persist.  If you have any worsening symptoms please return for reevaluation.
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
## 2020-07-12 ENCOUNTER — Ambulatory Visit
Admission: EM | Admit: 2020-07-12 | Discharge: 2020-07-12 | Disposition: A | Discharge status: Home / Self Care | Payer: BLUE CROSS/BLUE SHIELD | Attending: Family Medicine | Admitting: Family Medicine

## 2020-07-12 ENCOUNTER — Encounter: Payer: Self-pay | Admitting: *Deleted

## 2020-07-12 ENCOUNTER — Other Ambulatory Visit: Payer: Self-pay

## 2020-07-12 DIAGNOSIS — J111 Influenza due to unidentified influenza virus with other respiratory manifestations: Secondary | ICD-10-CM | POA: Diagnosis not present

## 2020-07-12 DIAGNOSIS — R062 Wheezing: Secondary | ICD-10-CM

## 2020-07-12 HISTORY — DX: Unspecified asthma, uncomplicated: J45.909

## 2020-07-12 MED ORDER — PROMETHAZINE-DM 6.25-15 MG/5ML PO SYRP: 5.0000 mL | ORAL_SOLUTION | Freq: Four times a day (QID) | ORAL | 0 refills | Status: DC | PRN

## 2020-07-12 MED ORDER — ALBUTEROL SULFATE HFA 108 (90 BASE) MCG/ACT IN AERS: 1.0000 | INHALATION_SPRAY | Freq: Four times a day (QID) | RESPIRATORY_TRACT | 1 refills | Status: DC | PRN

## 2020-07-12 NOTE — ED Triage Notes (Signed)
C/O body aches, cough, HA, nasal congestion, chills onset yesterday.  Denies any known fevers.

## 2020-07-12 NOTE — ED Provider Notes (Signed)
  [facility]   [id number] 07/12/20 Arrival Time: 0815  ASSESSMENT & PLAN:  1. Influenza-like illness   2. Wheezing    COVID-19 testing sent. Discussed typical duration of viral illness.  Meds ordered this encounter  Medications  . promethazine-dextromethorphan (PROMETHAZINE-DM) 6.25-15 MG/5ML syrup    Sig: Take 5 mLs by mouth 4 (four) times daily as needed for cough.    Dispense:  118 mL    Refill:  0  . albuterol (VENTOLIN HFA) 108 (90 Base) MCG/ACT inhaler    Sig: Inhale 1-2 puffs into the lungs every 6 (six) hours as needed for wheezing or shortness of breath.    Dispense:  18 g    Refill:  1    Reviewed expectations re: course of current medical issues. Questions answered. Outlined signs and symptoms indicating need for more acute intervention. Understanding verbalized. After Visit Summary given.   SUBJECTIVE: History from: patient. Donald Maldonado is a 40 y.o. male who presents with worries regarding COVID-19. Known COVID-19 contact: none. Recent travel: none. Reports: body aches, HA, nasal congestion, chills; abrupt onset yesterday. Fatigued. Denies: fever and difficulty breathing. Normal PO intake without n/v/d.    OBJECTIVE:  Vitals:   07/12/20 0833  BP: 119/81  Pulse: (!) 107  Resp: 16  Temp: (!) 100.7 F (38.2 C)  TempSrc: Temporal  SpO2: 93%    Slight tachycardia noted.  General appearance: alert; no distress; appears fatigued Eyes: PERRLA; EOMI; conjunctiva normal HENT: [place]; AT; with nasal congestion Neck: supple  Lungs: speaks full sentences without difficulty; unlabored; active cough Extremities: no edema Skin: warm and dry Neurologic: normal gait Psychological: alert and cooperative; normal mood and affect  Labs:  Labs Reviewed  NOVEL CORONAVIRUS, NAA    No Known Allergies  Past Medical History:  Diagnosis Date  . Asthma   . Environmental allergies    Social History   Socioeconomic History  . Marital status: Single     Spouse name: Not on file  . Number of children: Not on file  . Years of education: Not on file  . Highest education level: Not on file  Occupational History  . Not on file  Tobacco Use  . Smoking status: Current Every Day Smoker    Packs/day: 1.00    Types: Cigarettes  . Smokeless tobacco: Never Used  Vaping Use  . Vaping Use: Never used  Substance and Sexual Activity  . Alcohol use: Yes    Comment: socially   . Drug use: Never  . Sexual activity: Not on file  Other Topics Concern  . Not on file  Social History Narrative  . Not on file   Social Determinants of Health   Financial Resource Strain: Not on file  Food Insecurity: Not on file  Transportation Needs: Not on file  Physical Activity: Not on file  Stress: Not on file  Social Connections: Not on file  Intimate Partner Violence: Not on file   Family History  Problem Relation Age of Onset  . Healthy Mother   . Healthy Father    History reviewed. No pertinent surgical history.   Mardella Layman, MD 07/12/20 1214

## 2020-07-12 NOTE — Discharge Instructions (Signed)

## 2020-07-14 LAB — NOVEL CORONAVIRUS, NAA: SARS-CoV-2, NAA: NOT DETECTED

## 2020-07-14 LAB — SARS-COV-2, NAA 2 DAY TAT

## 2020-08-20 ENCOUNTER — Ambulatory Visit
Admission: EM | Admit: 2020-08-20 | Discharge: 2020-08-20 | Disposition: A | Discharge status: Home / Self Care | Payer: BLUE CROSS/BLUE SHIELD | Attending: Emergency Medicine | Admitting: Emergency Medicine

## 2020-08-20 ENCOUNTER — Other Ambulatory Visit: Payer: Self-pay

## 2020-08-20 DIAGNOSIS — S63653A Sprain of metacarpophalangeal joint of left middle finger, initial encounter: Secondary | ICD-10-CM

## 2020-08-20 MED ORDER — IBUPROFEN 800 MG PO TABS: 800.0000 mg | ORAL_TABLET | Freq: Three times a day (TID) | ORAL | 0 refills | Status: DC

## 2020-08-20 NOTE — Discharge Instructions (Addendum)
Use anti-inflammatories for pain/swelling. You may take up to 800 mg Ibuprofen every 8 hours with food. You may supplement Ibuprofen with Tylenol [phone] mg every 8 hours.  Ice finger Gentle range of motion exercises Follow-up with sports medicine if not improving or worsening

## 2020-08-20 NOTE — ED Provider Notes (Signed)
[name]    CSN: [id number] Arrival date & time: 08/20/20  7253      History   Chief Complaint Chief Complaint  Patient presents with   Hand Pain    left    HPI Donald Maldonado is a 40 y.o. male history of asthma presenting today for evaluation of left hand pain and swelling reports 4 to 5 days of pain swelling and stiffness near his third MCP joint and into his third finger.  Denies specific injury or trauma recently.  Does report remote history of basketball injury in the past.  Denies any associated wrist pain today.  Reports that he will occasionally have other arthralgias/swelling in other fingers and wrist at times.  Denies any rashes.  Has not been taking anything for pain.  HPI  Past Medical History:  Diagnosis Date   Asthma    Environmental allergies     There are no problems to display for this patient.   History reviewed. No pertinent surgical history.     Home Medications    Prior to Admission medications   Medication Sig Start Date End Date Taking? Authorizing Provider  ibuprofen (ADVIL) 800 MG tablet Take 1 tablet (800 mg total) by mouth 3 (three) times daily. 08/20/20  Yes Ivyana Locey C, PA-C  albuterol (VENTOLIN HFA) 108 (90 Base) MCG/ACT inhaler Inhale 1-2 puffs into the lungs every 6 (six) hours as needed for wheezing or shortness of breath. 07/12/20   Mardella Layman, MD  budesonide-formoterol (SYMBICORT) 160-4.5 MCG/ACT inhaler Inhale 2 puffs into the lungs 2 (two) times daily. 11/21/19   Particia Nearing, PA-C  cetirizine (ZYRTEC ALLERGY) 10 MG tablet Take 1 tablet (10 mg total) by mouth daily. 09/25/19   Darr, Gerilyn Pilgrim, PA-C  fluticasone (FLONASE) 50 MCG/ACT nasal spray Place 1 spray into both nostrils daily. 09/25/19   Darr, Gerilyn Pilgrim, PA-C    Family History Family History  Problem Relation Age of Onset   Healthy Mother    Healthy Father     Social History Social History   Tobacco Use   Smoking status: Every Day    Packs/day:  1.00    Pack years: 0.00    Types: Cigarettes   Smokeless tobacco: Never  Vaping Use   Vaping Use: Never used  Substance Use Topics   Alcohol use: Yes    Comment: socially    Drug use: Never     Allergies   Patient has no known allergies.   Review of Systems Review of Systems  Constitutional:  Negative for fatigue and fever.  Eyes:  Negative for redness, itching and visual disturbance.  Respiratory:  Negative for shortness of breath.   Cardiovascular:  Negative for chest pain and leg swelling.  Gastrointestinal:  Negative for nausea and vomiting.  Musculoskeletal:  Positive for arthralgias and joint swelling. Negative for myalgias.  Skin:  Negative for color change, rash and wound.  Neurological:  Negative for dizziness, syncope, weakness, light-headedness and headaches.    Physical Exam Triage Vital Signs ED Triage Vitals  Enc Vitals Group     BP      Pulse      Resp      Temp      Temp src      SpO2      Weight      Height      Head Circumference      Peak Flow      Pain Score  Pain Loc      Pain Edu?      Excl. in GC?    No data found.  Updated Vital Signs BP 128/88 (BP Location: Left Arm)   Pulse 88   Temp 98.2 F (36.8 C) (Oral)   Resp 18   SpO2 98%   Visual Acuity Right Eye Distance:   Left Eye Distance:   Bilateral Distance:    Right Eye Near:   Left Eye Near:    Bilateral Near:     Physical Exam Vitals and nursing note reviewed.  Constitutional:      Appearance: He is well-developed.     Comments: No acute distress  HENT:     Head: Normocephalic and atraumatic.     Nose: Nose normal.  Eyes:     Conjunctiva/sclera: Conjunctivae normal.  Cardiovascular:     Rate and Rhythm: Normal rate.  Pulmonary:     Effort: Pulmonary effort is normal. No respiratory distress.  Abdominal:     General: There is no distension.  Musculoskeletal:        General: Normal range of motion.     Cervical back: Neck supple.     Comments: Left  hand: No obvious swelling or deformity, nontender to distal radius and ulna, nontender throughout carpal area, tenderness over third MCP joint on dorsal and palmar aspect, tenderness extending into proximal phalanx and around PIP, nontender to palpation to middle and distal phalanx or DIP.  Full active range of motion at MCP joint DIP and PIP, slightly decreased strength with adduction of third finger Grip strength 5/5 and equal bilaterally  Radial pulse 2+  Skin:    General: Skin is warm and dry.  Neurological:     Mental Status: He is alert and oriented to person, place, and time.     UC Treatments / Results  Labs (all labs ordered are listed, but only abnormal results are displayed) Labs Reviewed - No data to display  EKG   Radiology No results found.  Procedures Procedures (including critical care time)  Medications Ordered in UC Medications - No data to display  Initial Impression / Assessment and Plan / UC Course  I have reviewed the triage vital signs and the nursing notes.  Pertinent labs & imaging results that were available during my care of the patient were reviewed by me and considered in my medical decision making (see chart for details).     Left third MCP sprain/strain-no mechanism of injury, deferring imaging, full active range of motion, low suspicion of underlying tendon injury, recommending conservative treatment with anti-inflammatories ice and rest, continue to monitor, if continuing to have recurrent arthralgias in multiple joints to follow-up with sports medicine/primary care  Discussed strict return precautions. Patient verbalized understanding and is agreeable with plan.  Final Clinical Impressions(s) / UC Diagnoses   Final diagnoses:  Sprain of metacarpophalangeal (MCP) joint of left middle finger, initial encounter     Discharge Instructions      Use anti-inflammatories for pain/swelling. You may take up to 800 mg Ibuprofen every 8 hours  with food. You may supplement Ibuprofen with Tylenol [phone] mg every 8 hours.  Ice finger Gentle range of motion exercises Follow-up with sports medicine if not improving or worsening     ED Prescriptions     Medication Sig Dispense Auth. Provider   ibuprofen (ADVIL) 800 MG tablet Take 1 tablet (800 mg total) by mouth 3 (three) times daily. 21 tablet Wynonna Fitzhenry, [place] C, PA-C  PDMP not reviewed this encounter.   Lew Dawes, PA-C 08/20/20 1015

## 2020-08-20 NOTE — ED Triage Notes (Signed)
Three day h/o left hand pain with swelling that worsened as of yesterday while he was at work lifting. Pt notes pain with moving left digits. No meds taken. No known injuries. Denies right hand sxs.

## 2021-02-25 ENCOUNTER — Other Ambulatory Visit: Payer: Self-pay

## 2021-02-25 ENCOUNTER — Ambulatory Visit
Admission: RE | Admit: 2021-02-25 | Discharge: 2021-02-25 | Disposition: A | Discharge status: Home / Self Care | Payer: BLUE CROSS/BLUE SHIELD | Source: Ambulatory Visit | Attending: Internal Medicine | Admitting: Internal Medicine

## 2021-02-25 ENCOUNTER — Ambulatory Visit
Admission: EM | Admit: 2021-02-25 | Discharge: 2021-02-25 | Disposition: A | Discharge status: Home / Self Care | Payer: BLUE CROSS/BLUE SHIELD | Attending: Internal Medicine | Admitting: Internal Medicine

## 2021-02-25 DIAGNOSIS — M25561 Pain in right knee: Secondary | ICD-10-CM | POA: Diagnosis not present

## 2021-02-25 NOTE — ED Provider Notes (Signed)
[name]    CSN: [id number] Arrival date & time: 02/25/21  1021      History   Chief Complaint Chief Complaint  Patient presents with   right knee injury    HPI REEVE TURNLEY is a 40 y.o. male.   Patient presents with right knee pain that started approximately 6 days ago after an injury.  Patient reports that he was playing with his dog, and the dog ran into his knee.  Patient has had persistent pain in the lateral portion of the right knee since this injury.  Patient is able to bear weight but has pain with bearing weight.  Denies any numbness or tingling.    Past Medical History:  Diagnosis Date   Asthma    Environmental allergies     There are no problems to display for this patient.   History reviewed. No pertinent surgical history.     Home Medications    Prior to Admission medications   Medication Sig Start Date End Date Taking? Authorizing Provider  albuterol (VENTOLIN HFA) 108 (90 Base) MCG/ACT inhaler Inhale 1-2 puffs into the lungs every 6 (six) hours as needed for wheezing or shortness of breath. 07/12/20   Mardella Layman, MD  budesonide-formoterol (SYMBICORT) 160-4.5 MCG/ACT inhaler Inhale 2 puffs into the lungs 2 (two) times daily. 11/21/19   Particia Nearing, PA-C  cetirizine (ZYRTEC ALLERGY) 10 MG tablet Take 1 tablet (10 mg total) by mouth daily. 09/25/19   Darr, Gerilyn Pilgrim, PA-C  fluticasone (FLONASE) 50 MCG/ACT nasal spray Place 1 spray into both nostrils daily. 09/25/19   Darr, Gerilyn Pilgrim, PA-C  ibuprofen (ADVIL) 800 MG tablet Take 1 tablet (800 mg total) by mouth 3 (three) times daily. 08/20/20   Wieters, Junius Creamer, PA-C    Family History Family History  Problem Relation Age of Onset   Healthy Mother    Healthy Father     Social History Social History   Tobacco Use   Smoking status: Every Day    Packs/day: 1.00    Types: Cigarettes   Smokeless tobacco: Never  Vaping Use   Vaping Use: Never used  Substance Use Topics   Alcohol  use: Yes    Comment: socially    Drug use: Never     Allergies   Patient has no known allergies.   Review of Systems Review of Systems Per HPI  Physical Exam Triage Vital Signs ED Triage Vitals  Enc Vitals Group     BP 02/25/21 1107 134/85     Pulse Rate 02/25/21 1107 90     Resp 02/25/21 1107 18     Temp 02/25/21 1107 98.3 F (36.8 C)     Temp Source 02/25/21 1107 Oral     SpO2 02/25/21 1107 97 %     Weight --      Height --      Head Circumference --      Peak Flow --      Pain Score 02/25/21 1108 5     Pain Loc --      Pain Edu? --      Excl. in GC? --    No data found.  Updated Vital Signs BP 134/85 (BP Location: Right Arm)    Pulse 90    Temp 98.3 F (36.8 C) (Oral)    Resp 18    SpO2 97%   Visual Acuity Right Eye Distance:   Left Eye Distance:   Bilateral Distance:    Right  Eye Near:   Left Eye Near:    Bilateral Near:     Physical Exam Constitutional:      General: He is not in acute distress.    Appearance: Normal appearance. He is not toxic-appearing or diaphoretic.  HENT:     Head: Normocephalic and atraumatic.  Eyes:     Extraocular Movements: Extraocular movements intact.     Conjunctiva/sclera: Conjunctivae normal.  Pulmonary:     Effort: Pulmonary effort is normal.  Musculoskeletal:     Right knee: No swelling, deformity, effusion, erythema, ecchymosis, bony tenderness or crepitus. Normal range of motion. Tenderness present. No LCL laxity, MCL laxity, ACL laxity or PCL laxity. Normal alignment, normal meniscus and normal patellar mobility. Normal pulse.     Left knee: Normal.       Legs:     Comments: Tenderness to palpation lateral to right patella.  No erythema, abrasions, lacerations noted.  Patient has full range of motion.  Neurovascular intact.  Neurological:     General: No focal deficit present.     Mental Status: He is alert and oriented to person, place, and time. Mental status is at baseline.  Psychiatric:        Mood and  Affect: Mood normal.        Behavior: Behavior normal.        Thought Content: Thought content normal.        Judgment: Judgment normal.     UC Treatments / Results  Labs (all labs ordered are listed, but only abnormal results are displayed) Labs Reviewed - No data to display  EKG   Radiology No results found.  Procedures Procedures (including critical care time)  Medications Ordered in UC Medications - No data to display  Initial Impression / Assessment and Plan / UC Course  I have reviewed the triage vital signs and the nursing notes.  Pertinent labs & imaging results that were available during my care of the patient were reviewed by me and considered in my medical decision making (see chart for details).     Contusion of right knee.  Low suspicion for any acute bony abnormality.  Will perform x-ray to rule out bony abnormality.  No x-ray tech in urgent care today.  Outpatient imaging ordered at [facility] imaging.  Discussed supportive care and ice application with patient.  Discussed strict return precautions.  Patient provided with contact information for orthopedist if pain persists over the next 2 weeks.  Will call patient if there are any abnormalities on x-ray imaging.  Patient verbalized understanding and was agreeable with plan. Final Clinical Impressions(s) / UC Diagnoses   Final diagnoses:  Acute pain of right knee     Discharge Instructions      Please go to [facility] imaging center to have x-ray performed.  We will call if there are any abnormalities on the x-ray.  You may take ibuprofen and use ice application for pain.    ED Prescriptions   None    PDMP not reviewed this encounter.   Gustavus Bryant, [place] 02/25/21 1144

## 2021-02-25 NOTE — Discharge Instructions (Signed)
Please go to [facility] imaging center to have x-ray performed.  We will call if there are any abnormalities on the x-ray.  You may take ibuprofen and use ice application for pain.

## 2021-02-25 NOTE — ED Triage Notes (Signed)
Pt c/o right knee pain stating he was playing with dog on thurs night and was "head butted" by the dog and now has knee pain.

## 2021-02-26 NOTE — Progress Notes (Signed)
Attempted to call patient. No answer. LVM.

## 2021-02-26 NOTE — Progress Notes (Signed)
Normal. No change to plan at this time.

## 2021-05-27 ENCOUNTER — Encounter: Payer: Self-pay | Admitting: Emergency Medicine

## 2021-05-27 ENCOUNTER — Other Ambulatory Visit: Payer: Self-pay

## 2021-05-27 ENCOUNTER — Ambulatory Visit
Admission: EM | Admit: 2021-05-27 | Discharge: 2021-05-27 | Disposition: A | Discharge status: Home / Self Care | Payer: BLUE CROSS/BLUE SHIELD

## 2021-05-27 DIAGNOSIS — J069 Acute upper respiratory infection, unspecified: Secondary | ICD-10-CM | POA: Diagnosis not present

## 2021-05-27 NOTE — ED Provider Notes (Signed)
?[name] ? ? ? ?CSN: [id number] ?Arrival date & time: 05/27/21  3818 ? ? ?  ? ?History   ?Chief Complaint ?Chief Complaint  ?Patient presents with  ? Cough  ? Sore Throat  ? Nasal Congestion  ? ? ?HPI ?Donald Maldonado is a 41 y.o. male.  ? ?Patient here today for evaluation of cough, nasal drainage and sore throat that started about 3 days ago.  He reports that he has not had fever.  He denies any vomiting or diarrhea.  He does not report any treatment for symptoms.  He does note that he has had family members that have recently been sick. ? ?The history is provided by the patient.  ? ?Past Medical History:  ?Diagnosis Date  ? Asthma   ? Environmental allergies   ? ? ?There are no problems to display for this patient. ? ? ?History reviewed. No pertinent surgical history. ? ? ? ? ?Home Medications   ? ?Prior to Admission medications   ?Medication Sig Start Date End Date Taking? Authorizing Provider  ?albuterol (VENTOLIN HFA) 108 (90 Base) MCG/ACT inhaler Inhale 1-2 puffs into the lungs every 6 (six) hours as needed for wheezing or shortness of breath. 07/12/20   Mardella Layman, MD  ?amphetamine-dextroamphetamine (ADDERALL XR) 30 MG 24 hr capsule Take 30 mg by mouth every morning. 05/23/21   [provider]  ?budesonide-formoterol (SYMBICORT) 160-4.5 MCG/ACT inhaler Inhale 2 puffs into the lungs 2 (two) times daily. 11/21/19   Particia Nearing, PA-C  ?cetirizine (ZYRTEC ALLERGY) 10 MG tablet Take 1 tablet (10 mg total) by mouth daily. 09/25/19   Darr, Gerilyn Pilgrim, PA-C  ?fluticasone (FLONASE) 50 MCG/ACT nasal spray Place 1 spray into both nostrils daily. 09/25/19   Darr, Gerilyn Pilgrim, PA-C  ?ibuprofen (ADVIL) 800 MG tablet Take 1 tablet (800 mg total) by mouth 3 (three) times daily. 08/20/20   Wieters, Hallie C, PA-C  ?mirtazapine (REMERON) 7.5 MG tablet Take 7.5 mg by mouth at bedtime. 05/18/21   [provider]  ? ? ?Family History ?Family History  ?Problem Relation Age of Onset  ? Healthy Mother   ?  Healthy Father   ? ? ?Social History ?Social History  ? ?Tobacco Use  ? Smoking status: Every Day  ?  Packs/day: 1.00  ?  Types: Cigarettes  ? Smokeless tobacco: Never  ?Vaping Use  ? Vaping Use: Never used  ?Substance Use Topics  ? Alcohol use: Yes  ?  Comment: socially   ? Drug use: Never  ? ? ? ?Allergies   ?Patient has no known allergies. ? ? ?Review of Systems ?Review of Systems  ?Constitutional:  Negative for chills and fever.  ?HENT:  Positive for congestion, rhinorrhea and sore throat. Negative for ear pain.   ?Eyes:  Negative for discharge and redness.  ?Respiratory:  Positive for cough. Negative for shortness of breath.   ?Gastrointestinal:  Negative for abdominal pain, diarrhea, nausea and vomiting.  ? ? ?Physical Exam ?Triage Vital Signs ?ED Triage Vitals  ?Enc Vitals Group  ?   BP   ?   Pulse   ?   Resp   ?   Temp   ?   Temp src   ?   SpO2   ?   Weight   ?   Height   ?   Head Circumference   ?   Peak Flow   ?   Pain Score   ?   Pain Loc   ?  Pain Edu?   ?   Excl. in GC?   ? ?No data found. ? ?Updated Vital Signs ?BP 124/82   Pulse 90   Temp 98.1 ?F (36.7 ?C) (Oral)   Resp 18   SpO2 94%  ?   ? ?Physical Exam ?Vitals and nursing note reviewed.  ?Constitutional:   ?   General: He is not in acute distress. ?   Appearance: Normal appearance. He is not ill-appearing.  ?HENT:  ?   Head: Normocephalic and atraumatic.  ?   Nose: Congestion present.  ?   Mouth/Throat:  ?   Mouth: Mucous membranes are moist.  ?   Pharynx: Oropharynx is clear. Posterior oropharyngeal erythema present. No oropharyngeal exudate.  ?Eyes:  ?   Conjunctiva/sclera: Conjunctivae normal.  ?Cardiovascular:  ?   Rate and Rhythm: Normal rate and regular rhythm.  ?   Heart sounds: Normal heart sounds. No murmur heard. ?Pulmonary:  ?   Effort: Pulmonary effort is normal. No respiratory distress.  ?   Breath sounds: Normal breath sounds. No wheezing, rhonchi or rales.  ?Skin: ?   General: Skin is warm and dry.  ?Neurological:  ?   Mental  Status: He is alert.  ?Psychiatric:     ?   Mood and Affect: Mood normal.     ?   Thought Content: Thought content normal.  ? ? ? ?UC Treatments / Results  ?Labs ?(all labs ordered are listed, but only abnormal results are displayed) ?Labs Reviewed  ?COVID-19, FLU A+B NAA  ? ? ?EKG ? ? ?Radiology ?No results found. ? ?Procedures ?Procedures (including critical care time) ? ?Medications Ordered in UC ?Medications - No data to display ? ?Initial Impression / Assessment and Plan / UC Course  ?I have reviewed the triage vital signs and the nursing notes. ? ?Pertinent labs & imaging results that were available during my care of the patient were reviewed by me and considered in my medical decision making (see chart for details). ? ?  ?Suspect likely viral etiology of symptoms.  Will screen for COVID and flu.  Recommend symptomatic treatment, rest and increase fluids.  Work note provided.  Encouraged follow-up if symptoms fail to improve or worsen. ? ?Final Clinical Impressions(s) / UC Diagnoses  ? ?Final diagnoses:  ?Acute upper respiratory infection  ? ?Discharge Instructions   ?None ?  ? ?ED Prescriptions   ?None ?  ? ?PDMP not reviewed this encounter. ?  ?Tomi Bamberger, PA-C ?05/27/21 [phone] ? ?

## 2021-05-27 NOTE — ED Triage Notes (Signed)
Pt is present today with cough, nasal drainage, and sore throat. Pt sx started this past weekend.  ?

## 2021-05-28 LAB — COVID-19, FLU A+B NAA
Influenza A, NAA: NOT DETECTED
Influenza B, NAA: NOT DETECTED
SARS-CoV-2, NAA: NOT DETECTED

## 2021-10-06 ENCOUNTER — Ambulatory Visit (HOSPITAL_COMMUNITY)
Admission: EM | Admit: 2021-10-06 | Discharge: 2021-10-06 | Disposition: A | Discharge status: Home / Self Care | Payer: BLUE CROSS/BLUE SHIELD | Attending: Family Medicine | Admitting: Family Medicine

## 2021-10-06 ENCOUNTER — Encounter (HOSPITAL_COMMUNITY): Payer: Self-pay | Admitting: Emergency Medicine

## 2021-10-06 DIAGNOSIS — J069 Acute upper respiratory infection, unspecified: Secondary | ICD-10-CM | POA: Insufficient documentation

## 2021-10-06 DIAGNOSIS — Z20822 Contact with and (suspected) exposure to covid-19: Secondary | ICD-10-CM | POA: Diagnosis not present

## 2021-10-06 NOTE — ED Triage Notes (Signed)
Pt is present today with c/o cough and body aches. Pt sx started yesterday. Pt states that he was exposed to vide x2 days ago

## 2021-10-06 NOTE — ED Provider Notes (Signed)
[facility]    CSN: [id number] Arrival date & time: 10/06/21  1410      History   Chief Complaint Chief Complaint  Patient presents with   Cough   Generalized Body Aches    HPI Donald Maldonado is a 41 y.o. male.    Cough  Here with nasal congestion and rhinorrhea and cough that began yesterday.  He has not noted any fever or chills.  He has had some muscle aches in his upper back.  No nausea and vomiting but he has had some frequent stooling that is a little loose.  No headache or ear pain or sore throat.  No shortness of breath or wheezing or weakness at this point.  He was around his mother about 3 days ago and she later tested positive for COVID  He has had a history of "bronchitis", and has had to use an inhaler before.  He does smoke tobacco.  Past Medical History:  Diagnosis Date   Asthma    Environmental allergies     There are no problems to display for this patient.   History reviewed. No pertinent surgical history.     Home Medications    Prior to Admission medications   Medication Sig Start Date End Date Taking? Authorizing Provider  amphetamine-dextroamphetamine (ADDERALL XR) 30 MG 24 hr capsule Take 30 mg by mouth every morning. 05/23/21   [provider]  cetirizine (ZYRTEC ALLERGY) 10 MG tablet Take 1 tablet (10 mg total) by mouth daily. 09/25/19   Darr, Gerilyn Pilgrim, PA-C  fluticasone (FLONASE) 50 MCG/ACT nasal spray Place 1 spray into both nostrils daily. 09/25/19   Darr, Gerilyn Pilgrim, PA-C  mirtazapine (REMERON) 7.5 MG tablet Take 7.5 mg by mouth at bedtime. 05/18/21   [provider]    Family History Family History  Problem Relation Age of Onset   Healthy Mother    Healthy Father     Social History Social History   Tobacco Use   Smoking status: Every Day    Packs/day: 1.00    Types: Cigarettes   Smokeless tobacco: Never  Vaping Use   Vaping Use: Never used  Substance Use Topics   Alcohol use: Yes    Comment:  socially    Drug use: Never     Allergies   Patient has no known allergies.   Review of Systems Review of Systems  Respiratory:  Positive for cough.      Physical Exam Triage Vital Signs ED Triage Vitals  Enc Vitals Group     BP 10/06/21 1421 (!) 139/92     Pulse Rate 10/06/21 1421 90     Resp 10/06/21 1421 17     Temp 10/06/21 1421 97.7 F (36.5 C)     Temp src --      SpO2 10/06/21 1421 98 %     Weight --      Height --      Head Circumference --      Peak Flow --      Pain Score 10/06/21 1420 7     Pain Loc --      Pain Edu? --      Excl. in GC? --    No data found.  Updated Vital Signs BP (!) 139/92   Pulse 90   Temp 97.7 F (36.5 C)   Resp 17   SpO2 98%   Visual Acuity Right Eye Distance:   Left Eye Distance:   Bilateral Distance:  Right Eye Near:   Left Eye Near:    Bilateral Near:     Physical Exam Vitals reviewed.  Constitutional:      General: He is not in acute distress.    Appearance: He is not ill-appearing, toxic-appearing or diaphoretic.  HENT:     Right Ear: Tympanic membrane and ear canal normal.     Left Ear: Tympanic membrane and ear canal normal.     Nose: Nose normal.     Mouth/Throat:     Mouth: Mucous membranes are moist.     Pharynx: No oropharyngeal exudate or posterior oropharyngeal erythema.  Eyes:     Extraocular Movements: Extraocular movements intact.     Conjunctiva/sclera: Conjunctivae normal.     Pupils: Pupils are equal, round, and reactive to light.  Cardiovascular:     Rate and Rhythm: Normal rate and regular rhythm.     Heart sounds: No murmur heard. Pulmonary:     Effort: Pulmonary effort is normal. No respiratory distress.     Breath sounds: No stridor. No wheezing, rhonchi or rales.  Musculoskeletal:     Cervical back: Neck supple.     Right lower leg: No edema.     Left lower leg: No edema.  Lymphadenopathy:     Cervical: No cervical adenopathy.  Skin:    Capillary Refill: Capillary refill  takes less than 2 seconds.     Coloration: Skin is not jaundiced or pale.  Neurological:     General: No focal deficit present.     Mental Status: He is alert and oriented to person, place, and time.  Psychiatric:        Behavior: Behavior normal.      UC Treatments / Results  Labs (all labs ordered are listed, but only abnormal results are displayed) Labs Reviewed  SARS CORONAVIRUS 2 (TAT 6-24 HRS)    EKG   Radiology No results found.  Procedures Procedures (including critical care time)  Medications Ordered in UC Medications - No data to display  Initial Impression / Assessment and Plan / UC Course  I have reviewed the triage vital signs and the nursing notes.  Pertinent labs & imaging results that were available during my care of the patient were reviewed by me and considered in my medical decision making (see chart for details).     He has been swabbed for COVID.  This would be day 2 of symptoms, and he is at risk for severe disease.  Last creatinine done in epic was normal.  If he is positive for COVID, he should have a prescription for Paxlovid to reduce the risk of severe disease Final Clinical Impressions(s) / UC Diagnoses   Final diagnoses:  Viral URI with cough     Discharge Instructions       You have been swabbed for COVID, and the test will result in the next 24 hours. Our staff will call you if positive. If the test is positive, you should quarantine for 5 days from the start of your symptoms.       ED Prescriptions   None    PDMP not reviewed this encounter.   Zenia Resides, MD 10/06/21 [phone]

## 2021-10-06 NOTE — Discharge Instructions (Addendum)
  You have been swabbed for COVID, and the test will result in the next 24 hours. Our staff will call you if positive. If the test is positive, you should quarantine for 5 days from the start of your symptoms.  

## 2021-10-07 LAB — SARS CORONAVIRUS 2 (TAT 6-24 HRS): SARS Coronavirus 2: NEGATIVE

## 2022-06-04 ENCOUNTER — Ambulatory Visit
Admission: EM | Admit: 2022-06-04 | Discharge: 2022-06-04 | Disposition: A | Discharge status: Home / Self Care | Payer: PRIVATE HEALTH INSURANCE | Attending: Urgent Care | Admitting: Urgent Care

## 2022-06-04 DIAGNOSIS — F17200 Nicotine dependence, unspecified, uncomplicated: Secondary | ICD-10-CM | POA: Insufficient documentation

## 2022-06-04 DIAGNOSIS — U071 COVID-19: Secondary | ICD-10-CM | POA: Diagnosis present

## 2022-06-04 DIAGNOSIS — J309 Allergic rhinitis, unspecified: Secondary | ICD-10-CM | POA: Insufficient documentation

## 2022-06-04 DIAGNOSIS — J018 Other acute sinusitis: Secondary | ICD-10-CM | POA: Diagnosis present

## 2022-06-04 DIAGNOSIS — J4530 Mild persistent asthma, uncomplicated: Secondary | ICD-10-CM | POA: Diagnosis present

## 2022-06-04 MED ORDER — PROMETHAZINE-DM 6.25-15 MG/5ML PO SYRP: 5.0000 mL | ORAL_SOLUTION | Freq: Three times a day (TID) | ORAL | 0 refills | Status: DC | PRN

## 2022-06-04 MED ORDER — CETIRIZINE HCL 10 MG PO TABS: 10.0000 mg | ORAL_TABLET | Freq: Every day | ORAL | 0 refills | Status: DC

## 2022-06-04 MED ORDER — ALBUTEROL SULFATE HFA 108 (90 BASE) MCG/ACT IN AERS: 1.0000 | INHALATION_SPRAY | Freq: Four times a day (QID) | RESPIRATORY_TRACT | 0 refills | Status: DC | PRN

## 2022-06-04 MED ORDER — AMOXICILLIN 875 MG PO TABS: 875.0000 mg | ORAL_TABLET | Freq: Two times a day (BID) | ORAL | 0 refills | Status: DC

## 2022-06-04 MED ORDER — PREDNISONE 20 MG PO TABS
ORAL_TABLET | ORAL | 0 refills | Status: DC
Start: 2022-06-04 — End: 2023-05-12

## 2022-06-04 NOTE — ED Triage Notes (Signed)
Pt c/o prod cough, chills, body aches x 1 week-reports +covid exposure-NAD-steady gait

## 2022-06-04 NOTE — ED Provider Notes (Signed)
[facility]  Note:  This document was prepared using Systems analyst and may include unintentional dictation errors.  MRN: [id number] DOB: Feb 19, 1981  Subjective:   Donald Maldonado is a 42 y.o. male presenting for 1 week history of acute onset persistent and worsening body pains, chills, productive cough, chest congestion, sinus congestion and drainage.  Patient has had multiple exposures to COVID-19 at home including his girlfriend, brother.  He needs to get a COVID test for his work.  Has a history of allergies but does not take anything for them.  Has a history of asthma and is out of his inhaler.  Smokes 1/2ppd.  No current facility-administered medications for this encounter.  Current Outpatient Medications:    busPIRone (BUSPAR) 7.5 MG tablet, Take 7.5 mg by mouth 2 (two) times daily., Disp: , Rfl:    amphetamine-dextroamphetamine (ADDERALL XR) 30 MG 24 hr capsule, Take 30 mg by mouth every morning., Disp: , Rfl:    cetirizine (ZYRTEC ALLERGY) 10 MG tablet, Take 1 tablet (10 mg total) by mouth daily., Disp: 30 tablet, Rfl: 0   fluticasone (FLONASE) 50 MCG/ACT nasal spray, Place 1 spray into both nostrils daily., Disp: 15.8 mL, Rfl: 0   mirtazapine (REMERON) 7.5 MG tablet, Take 7.5 mg by mouth at bedtime., Disp: , Rfl:    No Known Allergies  Past Medical History:  Diagnosis Date   Asthma    Environmental allergies      History reviewed. No pertinent surgical history.  Family History  Problem Relation Age of Onset   Healthy Mother    Healthy Father     Social History   Tobacco Use   Smoking status: Every Day    Packs/day: 1    Types: Cigarettes   Smokeless tobacco: Never  Vaping Use   Vaping Use: Never used  Substance Use Topics   Alcohol use: Yes    Comment: socially    Drug use: Never    ROS   Objective:   Vitals: BP 122/64 (BP Location: Right Arm)   Pulse (!) 106   Temp 99.3 F (37.4 C) (Oral)   Resp 20    SpO2 96%   Physical Exam Constitutional:      General: He is not in acute distress.    Appearance: Normal appearance. He is well-developed and normal weight. He is ill-appearing. He is not toxic-appearing or diaphoretic.  HENT:     Head: Normocephalic and atraumatic.     Right Ear: Tympanic membrane, ear canal and external ear normal. No drainage, swelling or tenderness. No middle ear effusion. There is no impacted cerumen. Tympanic membrane is not erythematous or bulging.     Left Ear: Tympanic membrane, ear canal and external ear normal. No drainage, swelling or tenderness.  No middle ear effusion. There is no impacted cerumen. Tympanic membrane is not erythematous or bulging.     Nose: Congestion present. No rhinorrhea.     Mouth/Throat:     Mouth: Mucous membranes are moist.     Pharynx: No oropharyngeal exudate or posterior oropharyngeal erythema.  Eyes:     General: No scleral icterus.       Right eye: No discharge.        Left eye: No discharge.     Extraocular Movements: Extraocular movements intact.     Conjunctiva/sclera: Conjunctivae normal.  Cardiovascular:     Rate and Rhythm: Normal rate and regular rhythm.     Heart sounds: Normal heart  sounds. No murmur heard.    No friction rub. No gallop.  Pulmonary:     Effort: Pulmonary effort is normal. No respiratory distress.     Breath sounds: Normal breath sounds. No stridor. No wheezing, rhonchi or rales.  Musculoskeletal:     Cervical back: Normal range of motion and neck supple. No rigidity. No muscular tenderness.  Neurological:     General: No focal deficit present.     Mental Status: He is alert and oriented to person, place, and time.  Psychiatric:        Mood and Affect: Mood normal.        Behavior: Behavior normal.        Thought Content: Thought content normal.        Judgment: Judgment normal.     Assessment and Plan :   PDMP not reviewed this encounter.  1. Acute non-recurrent sinusitis of other sinus    2. Clinical diagnosis of COVID-19   3. Allergic rhinitis, unspecified seasonality, unspecified trigger   4. Mild persistent asthma without complication   5. Smoker     Patient has clinical diagnosis of COVID-19 given his presentation, symptoms and exposure, testing is pending.  He is past the timeframe to use antivirals.  Recommended addressing his symptoms for secondary sinusitis with amoxicillin.  Use supportive care in addition to that.  Due to his respiratory symptoms, asthma and smoking also recommended a prednisone course.  Refilled his albuterol inhaler.  Defer chest x-ray for now. Counseled patient on potential for adverse effects with medications prescribed/recommended today, ER and return-to-clinic precautions discussed, patient verbalized understanding.    Jaynee Eagles, PA-C 06/04/22 1620

## 2022-06-05 LAB — SARS CORONAVIRUS 2 (TAT 6-24 HRS): SARS Coronavirus 2: NEGATIVE

## 2022-06-30 IMAGING — CR DG KNEE COMPLETE 4+V*R*
4 series · 4 of 4 positions shown · non-contrast
Comparison: None.

CLINICAL DATA: Trauma 6 days ago with pain.

EXAM:
RIGHT KNEE - COMPLETE 4+ VIEW

[w knee ap right]
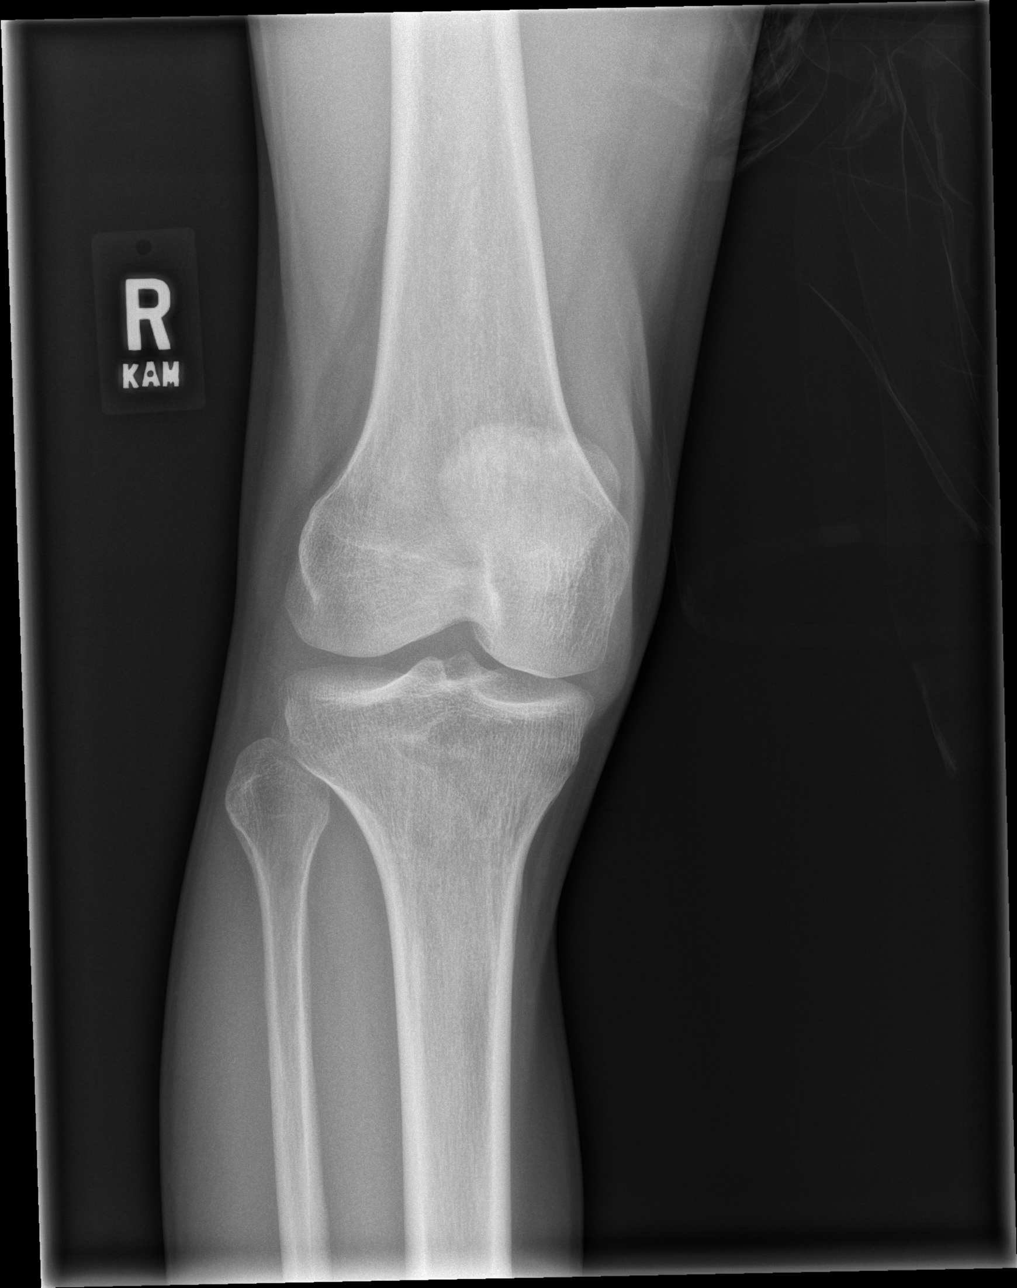

[w knee lat right]
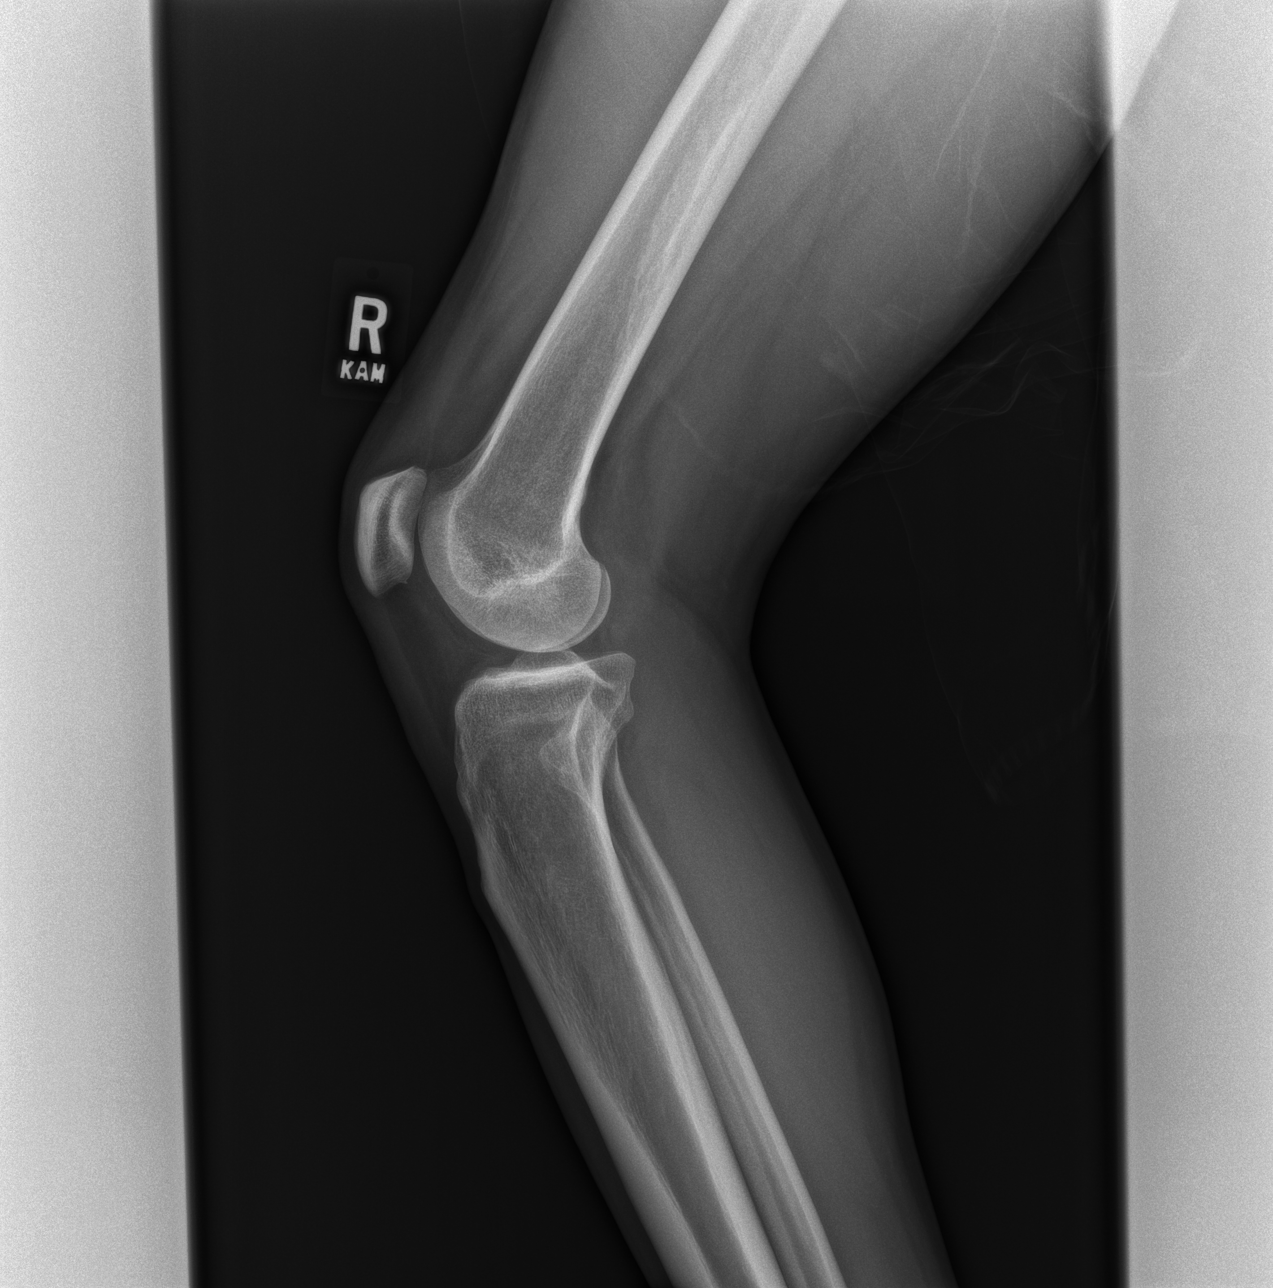

[w knee tunnel pa right]
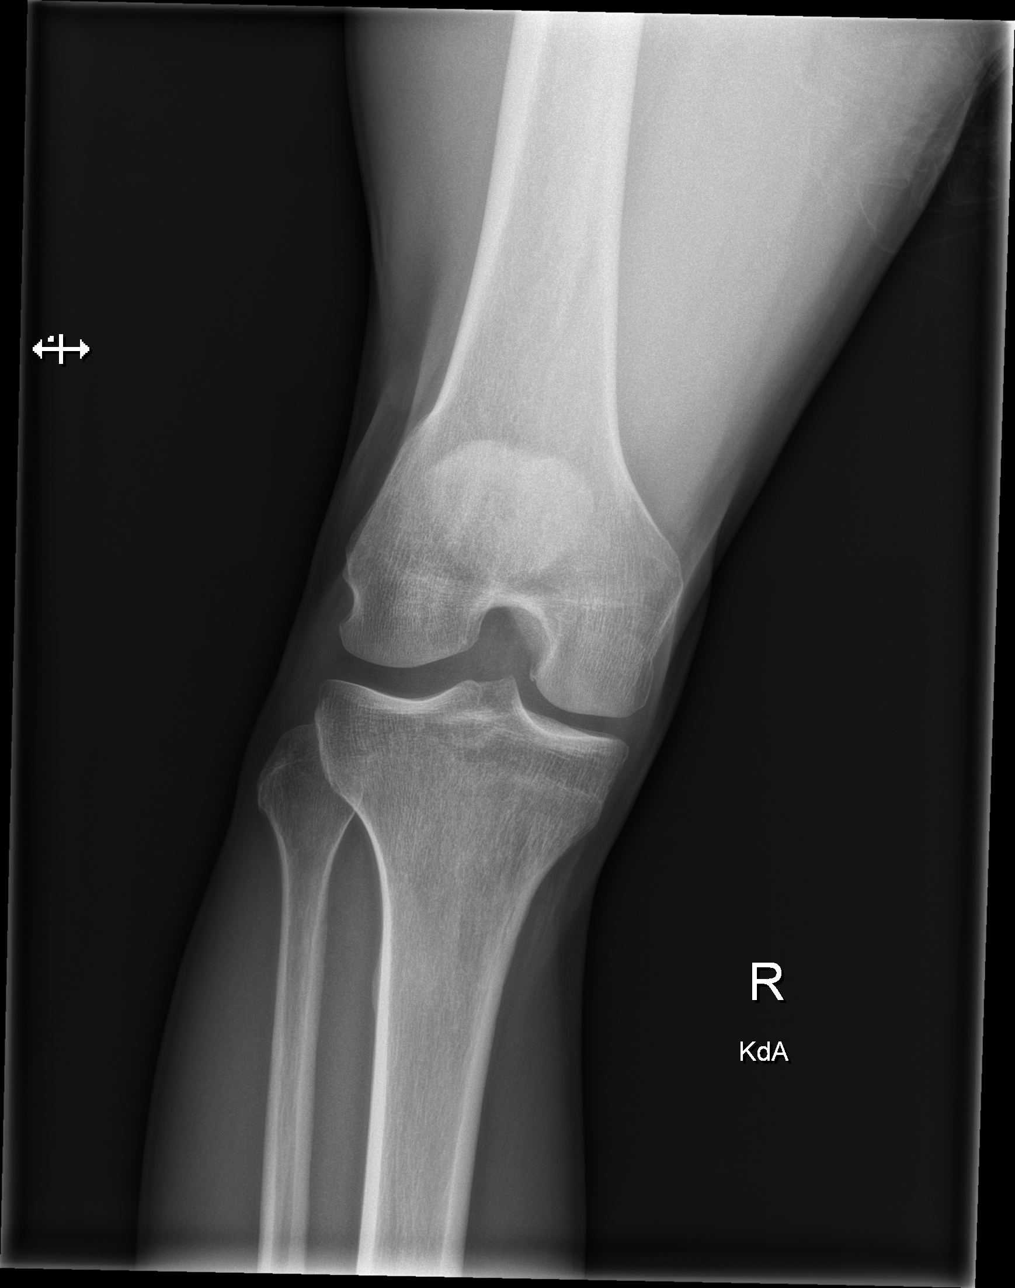

[x knee sunrise right]
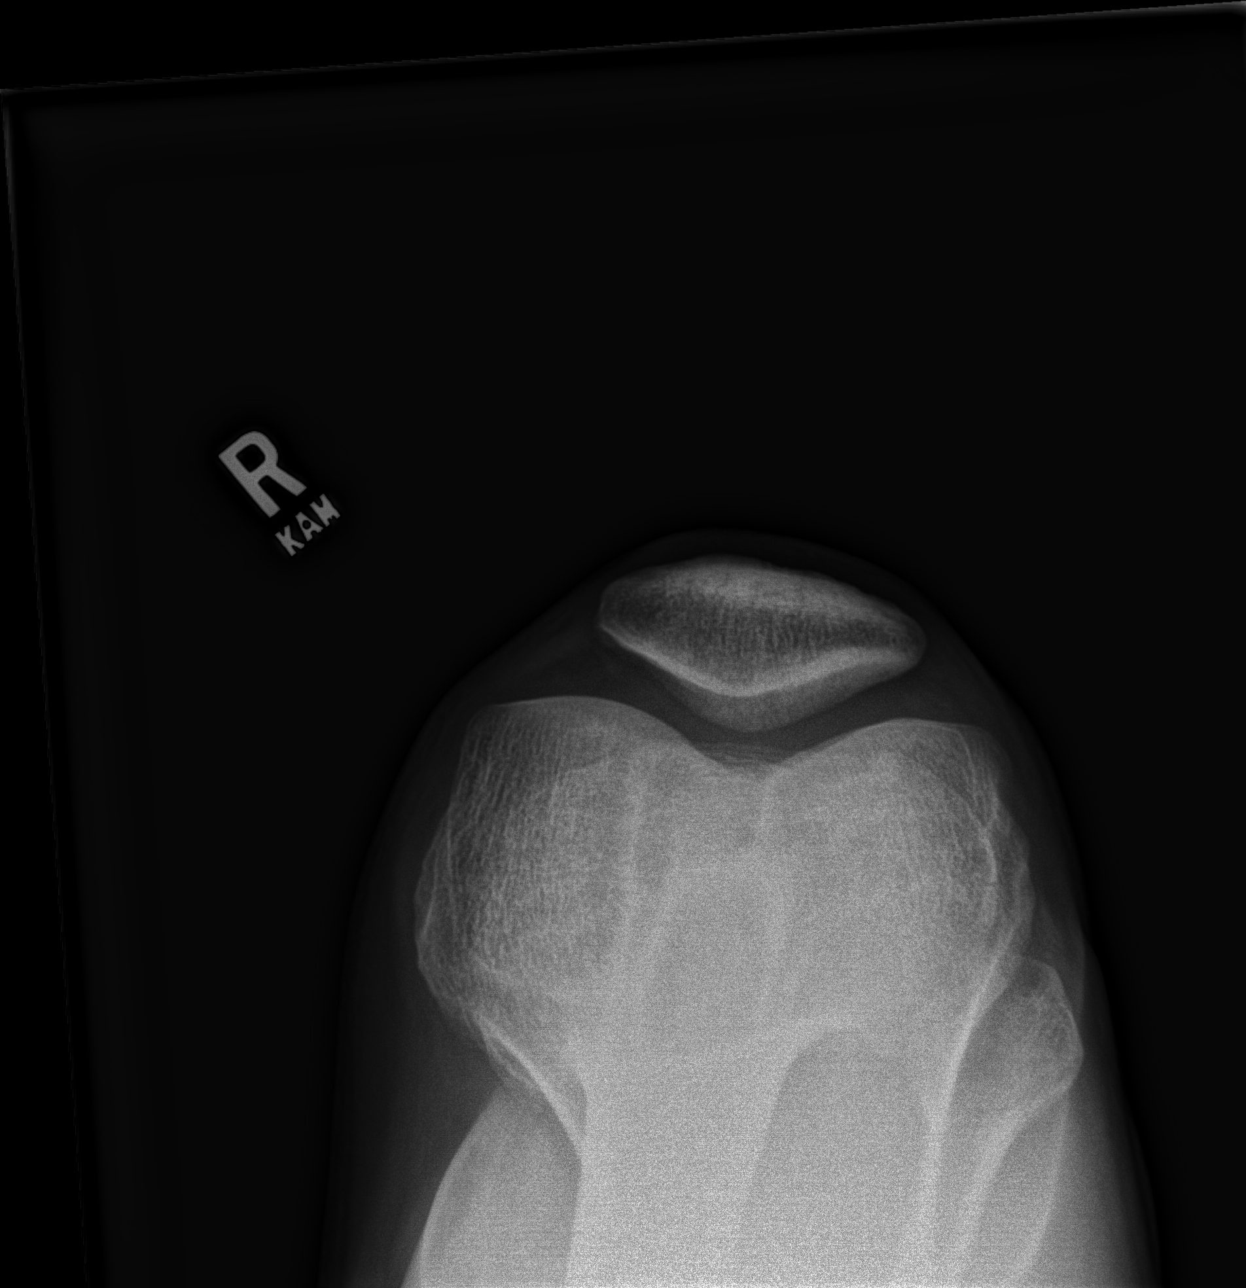

[4 of 4 positions shown; findings below may reference images not displayed]

FINDINGS: No acute fracture or dislocation. No joint effusion. Joint spaces
maintained.
IMPRESSION: No acute osseous abnormality.

## 2023-01-22 ENCOUNTER — Ambulatory Visit
Admission: EM | Admit: 2023-01-22 | Discharge: 2023-01-22 | Disposition: A | Discharge status: Home / Self Care | Payer: BLUE CROSS/BLUE SHIELD | Attending: Internal Medicine | Admitting: Internal Medicine

## 2023-01-22 DIAGNOSIS — B349 Viral infection, unspecified: Secondary | ICD-10-CM | POA: Diagnosis not present

## 2023-01-22 DIAGNOSIS — R051 Acute cough: Secondary | ICD-10-CM | POA: Diagnosis not present

## 2023-01-22 LAB — POC COVID19/FLU A&B COMBO
Covid Antigen, POC: NEGATIVE
Influenza A Antigen, POC: NEGATIVE
Influenza B Antigen, POC: NEGATIVE

## 2023-01-22 NOTE — Discharge Instructions (Signed)
Your symptoms and exam are consistent for a viral illness. Please treat your symptoms with over the counter cough medication, tylenol or ibuprofen, humidifier, and rest. Viral illnesses can last 7-14 days. Please follow up with your PCP if your symptoms are not improving. Please go to the ER for any worsening symptoms. This includes but is not limited to fever you can not control with tylenol or ibuprofen, you are not able to stay hydrated, you have shortness of breath or chest pain.  Thank you for choosing [facility] for your healthcare needs. I hope you feel better soon!  

## 2023-01-22 NOTE — ED Provider Notes (Signed)
[facility]    CSN: [id number] Arrival date & time: 01/22/23  1039      History   Chief Complaint Chief Complaint  Patient presents with   Fatigue    HPI Donald Maldonado is a 42 y.o. male  presents for evaluation of URI symptoms for 2 days. Patient reports associated symptoms of cough, congestion, body aches, chills, fatigue. Denies N/V/D, fever, sore throat, ear pain, shortness of breath. Patient does not have a hx of asthma. Patient is an active smoker.  Reports fianc has similar symptoms.  Pt has taken cold medicine OTC for symptoms. Pt has no other concerns at this time.   HPI  Past Medical History:  Diagnosis Date   Asthma    Environmental allergies     There are no problems to display for this patient.   History reviewed. No pertinent surgical history.     Home Medications    Prior to Admission medications   Medication Sig Start Date End Date Taking? Authorizing Provider  albuterol (VENTOLIN HFA) 108 (90 Base) MCG/ACT inhaler Inhale 1-2 puffs into the lungs every 6 (six) hours as needed for wheezing or shortness of breath. 06/04/22   Wallis Bamberg, PA-C  amoxicillin (AMOXIL) 875 MG tablet Take 1 tablet (875 mg total) by mouth 2 (two) times daily. 06/04/22   Wallis Bamberg, PA-C  amphetamine-dextroamphetamine (ADDERALL XR) 30 MG 24 hr capsule Take 30 mg by mouth every morning. 05/23/21   [provider]  busPIRone (BUSPAR) 7.5 MG tablet Take 7.5 mg by mouth 2 (two) times daily. 01/05/22   [provider]  cetirizine (ZYRTEC ALLERGY) 10 MG tablet Take 1 tablet (10 mg total) by mouth daily. 06/04/22   Wallis Bamberg, PA-C  fluticasone (FLONASE) 50 MCG/ACT nasal spray Place 1 spray into both nostrils daily. 09/25/19   Darr, Gerilyn Pilgrim, PA-C  mirtazapine (REMERON) 7.5 MG tablet Take 7.5 mg by mouth at bedtime. 05/18/21   [provider]  predniSONE (DELTASONE) 20 MG tablet Take 2 tablets daily with breakfast. 06/04/22   Wallis Bamberg, PA-C   promethazine-dextromethorphan (PROMETHAZINE-DM) 6.25-15 MG/5ML syrup Take 5 mLs by mouth 3 (three) times daily as needed for cough. 06/04/22   Wallis Bamberg, PA-C    Family History Family History  Problem Relation Age of Onset   Healthy Mother    Healthy Father     Social History Social History   Tobacco Use   Smoking status: Every Day    Current packs/day: 1.00    Types: Cigarettes   Smokeless tobacco: Never  Vaping Use   Vaping status: Never Used  Substance Use Topics   Alcohol use: Yes    Comment: socially    Drug use: Never     Allergies   Patient has no known allergies.   Review of Systems Review of Systems  Constitutional:  Positive for chills and fatigue.  HENT:  Positive for congestion.   Respiratory:  Positive for cough.   Musculoskeletal:  Positive for myalgias.     Physical Exam Triage Vital Signs ED Triage Vitals  Encounter Vitals Group     BP 01/22/23 1056 132/85     Systolic BP Percentile --      Diastolic BP Percentile --      Pulse Rate 01/22/23 1056 87     Resp 01/22/23 1056 20     Temp 01/22/23 1056 99.1 F (37.3 C)     Temp Source 01/22/23 1056 Oral     SpO2 01/22/23 1056  96 %     Weight --      Height --      Head Circumference --      Peak Flow --      Pain Score 01/22/23 1059 0     Pain Loc --      Pain Education --      Exclude from Growth Chart --    No data found.  Updated Vital Signs BP 132/85 (BP Location: Right Arm)   Pulse 87   Temp 99.1 F (37.3 C) (Oral)   Resp 20   SpO2 96%   Visual Acuity Right Eye Distance:   Left Eye Distance:   Bilateral Distance:    Right Eye Near:   Left Eye Near:    Bilateral Near:     Physical Exam Vitals and nursing note reviewed.  Constitutional:      General: He is not in acute distress.    Appearance: Normal appearance. He is not ill-appearing or toxic-appearing.  HENT:     Head: Normocephalic and atraumatic.     Right Ear: Tympanic membrane and ear canal normal.      Left Ear: Tympanic membrane and ear canal normal.     Nose: Congestion present.     Mouth/Throat:     Mouth: Mucous membranes are moist.     Pharynx: No oropharyngeal exudate or posterior oropharyngeal erythema.  Eyes:     Pupils: Pupils are equal, round, and reactive to light.  Cardiovascular:     Rate and Rhythm: Normal rate and regular rhythm.     Heart sounds: Normal heart sounds.  Pulmonary:     Effort: Pulmonary effort is normal.     Breath sounds: Normal breath sounds.  Musculoskeletal:     Cervical back: Normal range of motion and neck supple.  Lymphadenopathy:     Cervical: No cervical adenopathy.  Skin:    General: Skin is warm and dry.  Neurological:     General: No focal deficit present.     Mental Status: He is alert and oriented to person, place, and time.  Psychiatric:        Mood and Affect: Mood normal.        Behavior: Behavior normal.      UC Treatments / Results  Labs (all labs ordered are listed, but only abnormal results are displayed) Labs Reviewed  POC COVID19/FLU A&B COMBO    EKG   Radiology No results found.  Procedures Procedures (including critical care time)  Medications Ordered in UC Medications - No data to display  Initial Impression / Assessment and Plan / UC Course  I have reviewed the triage vital signs and the nursing notes.  Pertinent labs & imaging results that were available during my care of the patient were reviewed by me and considered in my medical decision making (see chart for details).     Reviewed exam and symptoms with patient.  No red flags.  Negative rapid flu and COVID test.  Discussed viral illness and symptomatic treatment.  Patient declined Rx cough medicine will use OTC medications as needed.  PCP follow-up if symptoms do not improve.  ER precautions reviewed. Final Clinical Impressions(s) / UC Diagnoses   Final diagnoses:  Acute cough  Viral illness     Discharge Instructions      Your symptoms  and exam are consistent for a viral illness. Please treat your symptoms with over the counter cough medication, tylenol or ibuprofen, humidifier, and rest. Viral  illnesses can last 7-14 days. Please follow up with your PCP if your symptoms are not improving. Please go to the ER for any worsening symptoms. This includes but is not limited to fever you can not control with tylenol or ibuprofen, you are not able to stay hydrated, you have shortness of breath or chest pain.  Thank you for choosing [facility] for your healthcare needs. I hope you feel better soon!      ED Prescriptions   None    PDMP not reviewed this encounter.   Radford Pax, NP 01/22/23 1140

## 2023-01-22 NOTE — ED Triage Notes (Signed)
Pt presents with c/o persistent productive cough, body aches and fatigue X 2 days

## 2023-05-12 ENCOUNTER — Ambulatory Visit
Admission: EM | Admit: 2023-05-12 | Discharge: 2023-05-12 | Disposition: A | Discharge status: Home / Self Care | Attending: Family Medicine | Admitting: Family Medicine

## 2023-05-12 DIAGNOSIS — J4530 Mild persistent asthma, uncomplicated: Secondary | ICD-10-CM | POA: Diagnosis not present

## 2023-05-12 DIAGNOSIS — J329 Chronic sinusitis, unspecified: Secondary | ICD-10-CM

## 2023-05-12 DIAGNOSIS — J40 Bronchitis, not specified as acute or chronic: Secondary | ICD-10-CM

## 2023-05-12 LAB — POC COVID19/FLU A&B COMBO
Covid Antigen, POC: NEGATIVE
Influenza A Antigen, POC: NEGATIVE
Influenza B Antigen, POC: NEGATIVE

## 2023-05-12 MED ORDER — PROMETHAZINE-DM 6.25-15 MG/5ML PO SYRP: 5.0000 mL | ORAL_SOLUTION | Freq: Three times a day (TID) | ORAL | 0 refills | Status: DC | PRN

## 2023-05-12 MED ORDER — ALBUTEROL SULFATE HFA 108 (90 BASE) MCG/ACT IN AERS: 1.0000 | INHALATION_SPRAY | Freq: Four times a day (QID) | RESPIRATORY_TRACT | 0 refills | Status: DC | PRN

## 2023-05-12 MED ORDER — AMOXICILLIN-POT CLAVULANATE 875-125 MG PO TABS: 1.0000 | ORAL_TABLET | Freq: Two times a day (BID) | ORAL | 0 refills | Status: DC

## 2023-05-12 MED ORDER — PREDNISONE 20 MG PO TABS: ORAL_TABLET | ORAL | 0 refills | Status: DC

## 2023-05-12 NOTE — Discharge Instructions (Signed)
 Please start amoxicillin-clavulanate and prednisone for your sinobronchitis infection. Use albuterol as needed as well as cough syrup. Hold your smoking as much as possible or quit all together.

## 2023-05-12 NOTE — ED Triage Notes (Signed)
 Pt c/o body aches, head/chest congestion started yesterday-cough x 1 week-denies known fever-NAD-steady gait

## 2023-05-12 NOTE — ED Provider Notes (Signed)
 [facility]  Note:  This document was prepared using Conservation officer, historic buildings and may include unintentional dictation errors.  MRN: [id number] DOB: 06/18/1980  Subjective:   Donald Maldonado is a 43 y.o. male presenting for 1 week history of acute onset body aches, sinus congestion, drainage, chest congestion/pain with his coughing. Has asthma, smokes.   Takes Adderall.   No Known Allergies  Past Medical History:  Diagnosis Date   Asthma    Environmental allergies      History reviewed. No pertinent surgical history.  Family History  Problem Relation Age of Onset   Healthy Mother    Healthy Father     Social History   Tobacco Use   Smoking status: Every Day    Current packs/day: 1.00    Types: Cigarettes   Smokeless tobacco: Never  Vaping Use   Vaping status: Never Used  Substance Use Topics   Alcohol use: Yes    Comment: occ   Drug use: Never    ROS   Objective:   Vitals: BP 121/83 (BP Location: Left Arm)   Pulse 78   Temp 98.2 F (36.8 C) (Oral)   Resp 16   SpO2 96%   Physical Exam Constitutional:      General: He is not in acute distress.    Appearance: Normal appearance. He is well-developed and normal weight. He is not ill-appearing, toxic-appearing or diaphoretic.  HENT:     Head: Normocephalic and atraumatic.     Right Ear: Tympanic membrane, ear canal and external ear normal. No drainage, swelling or tenderness. No middle ear effusion. There is no impacted cerumen. Tympanic membrane is not erythematous or bulging.     Left Ear: Tympanic membrane, ear canal and external ear normal. No drainage, swelling or tenderness.  No middle ear effusion. There is no impacted cerumen. Tympanic membrane is not erythematous or bulging.     Nose: Nose normal. No congestion or rhinorrhea.     Mouth/Throat:     Mouth: Mucous membranes are moist.     Pharynx: No oropharyngeal exudate or posterior oropharyngeal erythema.  Eyes:      General: No scleral icterus.       Right eye: No discharge.        Left eye: No discharge.     Extraocular Movements: Extraocular movements intact.     Conjunctiva/sclera: Conjunctivae normal.  Cardiovascular:     Rate and Rhythm: Normal rate and regular rhythm.     Heart sounds: Normal heart sounds. No murmur heard.    No friction rub. No gallop.  Pulmonary:     Effort: Pulmonary effort is normal. No respiratory distress.     Breath sounds: Normal breath sounds. No stridor. No wheezing, rhonchi or rales.  Musculoskeletal:     Cervical back: Normal range of motion and neck supple. No rigidity. No muscular tenderness.  Neurological:     General: No focal deficit present.     Mental Status: He is alert and oriented to person, place, and time.  Psychiatric:        Mood and Affect: Mood normal.        Behavior: Behavior normal.        Thought Content: Thought content normal.     Results for orders placed or performed during the hospital encounter of 05/12/23 (from the past 24 hours)  POC Covid + Flu A/B Antigen     Status: Normal   Collection Time: 05/12/23 12:20  PM  Result Value Ref Range   Influenza A Antigen, POC Negative    Influenza B Antigen, POC Negative    Covid Antigen, POC Negative     Assessment and Plan :   PDMP not reviewed this encounter.  1. Sinobronchitis   2. Mild persistent asthma, uncomplicated     Will cover bacterial sinobronchitis with Augmentin, prednisone.  Refilled albuterol, use supportive medication otherwise.  Counseled patient on potential for adverse effects with medications prescribed/recommended today, ER and return-to-clinic precautions discussed, patient verbalized understanding.    Wallis Bamberg, [place] 05/12/23 1349

## 2023-10-28 ENCOUNTER — Ambulatory Visit
Admission: EM | Admit: 2023-10-28 | Discharge: 2023-10-28 | Disposition: A | Discharge status: Home / Self Care | Attending: Family Medicine | Admitting: Family Medicine

## 2023-10-28 ENCOUNTER — Ambulatory Visit (INDEPENDENT_AMBULATORY_CARE_PROVIDER_SITE_OTHER)

## 2023-10-28 ENCOUNTER — Other Ambulatory Visit: Payer: Self-pay

## 2023-10-28 ENCOUNTER — Ambulatory Visit: Payer: Self-pay | Admitting: Nurse Practitioner

## 2023-10-28 DIAGNOSIS — R051 Acute cough: Secondary | ICD-10-CM | POA: Diagnosis not present

## 2023-10-28 DIAGNOSIS — J4521 Mild intermittent asthma with (acute) exacerbation: Secondary | ICD-10-CM | POA: Diagnosis not present

## 2023-10-28 DIAGNOSIS — J209 Acute bronchitis, unspecified: Secondary | ICD-10-CM | POA: Diagnosis not present

## 2023-10-28 HISTORY — DX: Attention-deficit hyperactivity disorder, unspecified type: F90.9

## 2023-10-28 MED ORDER — BENZONATATE 200 MG PO CAPS: 200.0000 mg | ORAL_CAPSULE | Freq: Three times a day (TID) | ORAL | 0 refills | Status: DC | PRN

## 2023-10-28 MED ORDER — AMOXICILLIN-POT CLAVULANATE 875-125 MG PO TABS: 1.0000 | ORAL_TABLET | Freq: Two times a day (BID) | ORAL | 0 refills | Status: DC

## 2023-10-28 MED ORDER — ALBUTEROL SULFATE HFA 108 (90 BASE) MCG/ACT IN AERS: 1.0000 | INHALATION_SPRAY | Freq: Four times a day (QID) | RESPIRATORY_TRACT | 0 refills | Status: DC | PRN

## 2023-10-28 MED ORDER — PREDNISONE 20 MG PO TABS: 40.0000 mg | ORAL_TABLET | Freq: Every day | ORAL | 0 refills | Status: AC

## 2023-10-28 NOTE — ED Triage Notes (Signed)
 Pt c/o productive cough w/white/green mucous, nasal congestionx1wk

## 2023-10-28 NOTE — Discharge Instructions (Signed)
 Start albuterol  inhaler as needed for wheezing or shortness of breath.  Start prednisone  daily for 5 days to help with this as well.  You may take Tessalon  3 times a day as needed for your cough.  A provisional prescription for Augmentin  has been provided.  Please do not take unless your symptoms do not improve or worsen over the next 3 days.  Lots of rest and fluids.  Please follow-up with your PCP in 2 to 3 days for recheck.  Please go to the ER if you develop any worsening symptoms.  Hope you feel better soon!

## 2023-10-28 NOTE — ED Provider Notes (Signed)
 [facility]    CSN: [id number] Arrival date & time: 10/28/23  0944      History   Chief Complaint No chief complaint on file.   HPI Donald Maldonado is a 43 y.o. male  presents for evaluation of URI symptoms for 7 days. Patient reports associated symptoms of cough, congestion, wheezing and shortness of breath. Denies N/V/D, fevers, sore throat, ear pain, body aches. Patient does have a hx of asthma.  States he does not have an inhaler or nebulizer.  Patient is an active smoker.   Reports sick contacts via fianc.  Pt has taken nothing OTC for symptoms. Pt has no other concerns at this time.   HPI  Past Medical History:  Diagnosis Date   ADHD    Asthma    Environmental allergies     There are no active problems to display for this patient.   History reviewed. No pertinent surgical history.     Home Medications    Prior to Admission medications   Medication Sig Start Date End Date Taking? Authorizing Provider  albuterol  (VENTOLIN  HFA) 108 (90 Base) MCG/ACT inhaler Inhale 1-2 puffs into the lungs every 6 (six) hours as needed. 10/28/23  Yes Sandrika Schwinn, Jodi R, NP  amoxicillin -clavulanate (AUGMENTIN ) 875-125 MG tablet Take 1 tablet by mouth every 12 (twelve) hours. 10/31/23  Yes Lachell Rochette, Jodi R, NP  benzonatate  (TESSALON ) 200 MG capsule Take 1 capsule (200 mg total) by mouth 3 (three) times daily as needed. 10/28/23  Yes Isatou Agredano, Jodi R, NP  predniSONE  (DELTASONE ) 20 MG tablet Take 2 tablets (40 mg total) by mouth daily with breakfast for 5 days. 10/28/23 11/02/23 Yes Neira Bentsen, Jodi R, NP  amphetamine-dextroamphetamine (ADDERALL XR) 30 MG 24 hr capsule Take 30 mg by mouth every morning. 05/23/21   [provider]    Family History Family History  Problem Relation Age of Onset   Healthy Mother    Healthy Father     Social History Social History   Tobacco Use   Smoking status: Every Day    Current packs/day: 1.00    Types: Cigarettes   Smokeless tobacco: Never   Vaping Use   Vaping status: Never Used  Substance Use Topics   Alcohol use: Yes    Comment: occ   Drug use: Never     Allergies   Patient has no known allergies.   Review of Systems Review of Systems  HENT:  Positive for congestion.   Respiratory:  Positive for cough, shortness of breath and wheezing.      Physical Exam Triage Vital Signs ED Triage Vitals  Encounter Vitals Group     BP 10/28/23 1015 110/76     Girls Systolic BP Percentile --      Girls Diastolic BP Percentile --      Boys Systolic BP Percentile --      Boys Diastolic BP Percentile --      Pulse Rate 10/28/23 1015 73     Resp 10/28/23 1015 16     Temp 10/28/23 1015 98.1 F (36.7 C)     Temp Source 10/28/23 1015 Oral     SpO2 10/28/23 1015 97 %     Weight --      Height --      Head Circumference --      Peak Flow --      Pain Score 10/28/23 1014 0     Pain Loc --      Pain Education --  Exclude from Growth Chart --    No data found.  Updated Vital Signs BP 110/76   Pulse 73   Temp 98.1 F (36.7 C) (Oral)   Resp 16   SpO2 97%   Visual Acuity Right Eye Distance:   Left Eye Distance:   Bilateral Distance:    Right Eye Near:   Left Eye Near:    Bilateral Near:     Physical Exam Vitals and nursing note reviewed.  Constitutional:      General: He is not in acute distress.    Appearance: Normal appearance. He is not ill-appearing or toxic-appearing.  HENT:     Head: Normocephalic and atraumatic.     Right Ear: Tympanic membrane and ear canal normal.     Left Ear: Tympanic membrane and ear canal normal.     Nose: Congestion present.     Mouth/Throat:     Mouth: Mucous membranes are moist.     Pharynx: No posterior oropharyngeal erythema.  Eyes:     Pupils: Pupils are equal, round, and reactive to light.  Cardiovascular:     Rate and Rhythm: Normal rate and regular rhythm.     Heart sounds: Normal heart sounds.  Pulmonary:     Effort: Pulmonary effort is normal.      Breath sounds: Normal breath sounds.     Comments: Faint wheezing expiratory right lower lung Musculoskeletal:     Cervical back: Normal range of motion and neck supple.  Lymphadenopathy:     Cervical: No cervical adenopathy.  Skin:    General: Skin is warm and dry.  Neurological:     General: No focal deficit present.     Mental Status: He is alert and oriented to person, place, and time.  Psychiatric:        Mood and Affect: Mood normal.        Behavior: Behavior normal.      UC Treatments / Results  Labs (all labs ordered are listed, but only abnormal results are displayed) Labs Reviewed - No data to display  EKG   Radiology DG Chest 2 View Result Date: 10/28/2023 CLINICAL DATA:  Cough and wheezing. EXAM: CHEST - 2 VIEW COMPARISON:  03/21/2015 FINDINGS: The heart size and mediastinal contours are within normal limits. Both lungs are clear. The visualized skeletal structures are unremarkable. IMPRESSION: No active cardiopulmonary disease. Electronically Signed   By: Camellia Candle M.D.   On: 10/28/2023 10:43    Procedures Procedures (including critical care time)  Medications Ordered in UC Medications - No data to display  Initial Impression / Assessment and Plan / UC Course  I have reviewed the triage vital signs and the nursing notes.  Pertinent labs & imaging results that were available during my care of the patient were reviewed by me and considered in my medical decision making (see chart for details).     Reviewed exam and symptoms with patient.  He declined nebulizer.  Chest x-ray negative.  Discussed asthma exacerbation and bronchitis.  Albuterol  inhaler as needed.  Prednisone  daily for 5 days.  Tessalon  as needed for cough.  Provisional prescription for Augmentin  provided with instruction not to take unless symptoms do not improve or worsen over the next 3 days and he verbalized understanding.  Encouraged rest fluids and PCP follow-up in 2 to 3 days for recheck.   ER precautions reviewed. Final Clinical Impressions(s) / UC Diagnoses   Final diagnoses:  Acute cough  Mild intermittent asthma with acute exacerbation  Acute bronchitis, unspecified organism     Discharge Instructions      Start albuterol  inhaler as needed for wheezing or shortness of breath.  Start prednisone  daily for 5 days to help with this as well.  You may take Tessalon  3 times a day as needed for your cough.  A provisional prescription for Augmentin  has been provided.  Please do not take unless your symptoms do not improve or worsen over the next 3 days.  Lots of rest and fluids.  Please follow-up with your PCP in 2 to 3 days for recheck.  Please go to the ER if you develop any worsening symptoms.  Hope you feel better soon!     ED Prescriptions     Medication Sig Dispense Auth. Provider   albuterol  (VENTOLIN  HFA) 108 (90 Base) MCG/ACT inhaler Inhale 1-2 puffs into the lungs every 6 (six) hours as needed. 1 each Loreda Myla SAUNDERS, NP   benzonatate  (TESSALON ) 200 MG capsule Take 1 capsule (200 mg total) by mouth 3 (three) times daily as needed. 20 capsule Kallum Jorgensen, Jodi R, NP   predniSONE  (DELTASONE ) 20 MG tablet Take 2 tablets (40 mg total) by mouth daily with breakfast for 5 days. 10 tablet Lagretta Loseke, Jodi R, NP   amoxicillin -clavulanate (AUGMENTIN ) 875-125 MG tablet Take 1 tablet by mouth every 12 (twelve) hours. 14 tablet Ethyl Vila, Jodi R, NP      PDMP not reviewed this encounter.   Loreda Myla SAUNDERS, NP 10/28/23 1050

## 2023-12-29 ENCOUNTER — Ambulatory Visit
Admission: RE | Admit: 2023-12-29 | Discharge: 2023-12-29 | Disposition: A | Discharge status: Home / Self Care | Source: Ambulatory Visit | Attending: Family Medicine | Admitting: Family Medicine

## 2023-12-29 ENCOUNTER — Ambulatory Visit: Payer: Self-pay

## 2023-12-29 ENCOUNTER — Other Ambulatory Visit: Payer: Self-pay

## 2023-12-29 VITALS — BP 134/88 | HR 89 | Temp 98.5°F | Resp 17

## 2023-12-29 DIAGNOSIS — R051 Acute cough: Secondary | ICD-10-CM

## 2023-12-29 DIAGNOSIS — J069 Acute upper respiratory infection, unspecified: Secondary | ICD-10-CM

## 2023-12-29 DIAGNOSIS — J4521 Mild intermittent asthma with (acute) exacerbation: Secondary | ICD-10-CM | POA: Diagnosis not present

## 2023-12-29 HISTORY — DX: Anxiety disorder, unspecified: F41.9

## 2023-12-29 LAB — POC COVID19/FLU A&B COMBO
Covid Antigen, POC: NEGATIVE
Influenza A Antigen, POC: NEGATIVE
Influenza B Antigen, POC: NEGATIVE

## 2023-12-29 MED ORDER — ALBUTEROL SULFATE HFA 108 (90 BASE) MCG/ACT IN AERS: 1.0000 | INHALATION_SPRAY | Freq: Four times a day (QID) | RESPIRATORY_TRACT | 0 refills | Status: AC | PRN

## 2023-12-29 MED ORDER — BENZONATATE 200 MG PO CAPS: 200.0000 mg | ORAL_CAPSULE | Freq: Three times a day (TID) | ORAL | 0 refills | Status: AC | PRN

## 2023-12-29 MED ORDER — PREDNISONE 20 MG PO TABS: 40.0000 mg | ORAL_TABLET | Freq: Every day | ORAL | 0 refills | Status: AC

## 2023-12-29 NOTE — ED Provider Notes (Signed)
 [facility]    CSN: [id number] Arrival date & time: 12/29/23  1627      History   Chief Complaint Chief Complaint  Patient presents with   Cough    Can't sleep hard to breath constantly coughing when laying flat - Entered by patient    HPI Donald Maldonado is a 43 y.o. male  presents for evaluation of URI symptoms for 2 days. Patient reports associated symptoms of cough and congestion with fatigue for. Denies N/V/D, [place], sore throat, ear pain, body aches, shortness of breath. Patient states he does not have asthma but chart review shows asthma listed in his past medical history.. Patient is an active smoker.   Reports no known sick contacts.  Pt has taken nothing OTC for symptoms. Pt has no other concerns at this time.    Cough   Past Medical History:  Diagnosis Date   ADHD    Anxiety    Asthma    Environmental allergies     There are no active problems to display for this patient.   History reviewed. No pertinent surgical history.     Home Medications    Prior to Admission medications   Medication Sig Start Date End Date Taking? Authorizing Provider  albuterol  (VENTOLIN  HFA) 108 (90 Base) MCG/ACT inhaler Inhale 1-2 puffs into the lungs every 6 (six) hours as needed. 12/29/23  Yes Estell Dillinger, Jodi R, NP  benzonatate  (TESSALON ) 200 MG capsule Take 1 capsule (200 mg total) by mouth 3 (three) times daily as needed. 12/29/23  Yes Wynema Garoutte, Jodi R, NP  busPIRone (BUSPAR) 7.5 MG tablet Take 7.5 mg by mouth 2 (two) times daily. 11/15/23  Yes [provider]  mirtazapine (REMERON) 7.5 MG tablet Take 7.5 mg by mouth at bedtime. 11/15/23  Yes [provider]  predniSONE  (DELTASONE ) 20 MG tablet Take 2 tablets (40 mg total) by mouth daily with breakfast for 5 days. 12/29/23 01/03/24 Yes Sam Wunschel, Jodi R, NP  amphetamine-dextroamphetamine (ADDERALL XR) 30 MG 24 hr capsule Take 30 mg by mouth every morning. 05/23/21   [provider]    Family  History Family History  Problem Relation Age of Onset   Healthy Mother    Healthy Father     Social History Social History   Tobacco Use   Smoking status: Every Day    Current packs/day: 1.00    Types: Cigarettes   Smokeless tobacco: Never  Vaping Use   Vaping status: Never Used  Substance Use Topics   Alcohol use: Yes    Comment: occ   Drug use: Never     Allergies   Patient has no known allergies.   Review of Systems Review of Systems  Constitutional:  Positive for fatigue.  HENT:  Positive for congestion.   Respiratory:  Positive for cough.      Physical Exam Triage Vital Signs ED Triage Vitals  Encounter Vitals Group     BP 12/29/23 1635 134/88     Girls Systolic BP Percentile --      Girls Diastolic BP Percentile --      Boys Systolic BP Percentile --      Boys Diastolic BP Percentile --      Pulse Rate 12/29/23 1635 89     Resp 12/29/23 1635 17     Temp 12/29/23 1635 98.5 F (36.9 C)     Temp Source 12/29/23 1635 Oral     SpO2 12/29/23 1635 98 %     Weight --  Height --      Head Circumference --      Peak Flow --      Pain Score 12/29/23 1633 0     Pain Loc --      Pain Education --      Exclude from Growth Chart --    No data found.  Updated Vital Signs BP 134/88   Pulse 89   Temp 98.5 F (36.9 C) (Oral)   Resp 17   SpO2 98%   Visual Acuity Right Eye Distance:   Left Eye Distance:   Bilateral Distance:    Right Eye Near:   Left Eye Near:    Bilateral Near:     Physical Exam Vitals and nursing note reviewed.  Constitutional:      General: He is not in acute distress.    Appearance: Normal appearance. He is not ill-appearing or toxic-appearing.  HENT:     Head: Normocephalic and atraumatic.     Right Ear: Tympanic membrane and ear canal normal.     Left Ear: Tympanic membrane and ear canal normal.     Nose: Congestion present.     Mouth/Throat:     Mouth: Mucous membranes are moist.     Pharynx: No posterior  oropharyngeal erythema.  Eyes:     Pupils: Pupils are equal, round, and reactive to light.  Cardiovascular:     Rate and Rhythm: Normal rate and regular rhythm.     Heart sounds: Normal heart sounds.  Pulmonary:     Effort: Pulmonary effort is normal.     Breath sounds: Normal breath sounds. No wheezing or rhonchi.  Musculoskeletal:     Cervical back: Normal range of motion and neck supple.  Lymphadenopathy:     Cervical: No cervical adenopathy.  Skin:    General: Skin is warm and dry.  Neurological:     General: No focal deficit present.     Mental Status: He is alert and oriented to person, place, and time.  Psychiatric:        Mood and Affect: Mood normal.        Behavior: Behavior normal.      UC Treatments / Results  Labs (all labs ordered are listed, but only abnormal results are displayed) Labs Reviewed  POC COVID19/FLU A&B COMBO    EKG   Radiology No results found.  Procedures Procedures (including critical care time)  Medications Ordered in UC Medications - No data to display  Initial Impression / Assessment and Plan / UC Course  I have reviewed the triage vital signs and the nursing notes.  Pertinent labs & imaging results that were available during my care of the patient were reviewed by me and considered in my medical decision making (see chart for details).     Reviewed exam and symptoms with patient.  No red flags.  Negative COVID and flu testing.  Discussed viral illness with asthma exacerbation.  I did refill his albuterol  inhaler and will do prednisone  daily for 5 days.  Tessalon  as needed for cough.  Encouraged rest fluids and PCP follow-up 2 to 3 days for recheck.  ER precautions reviewed and patient verbalized understanding. Final Clinical Impressions(s) / UC Diagnoses   Final diagnoses:  Acute cough  Mild intermittent asthma with acute exacerbation  Viral upper respiratory illness     Discharge Instructions      Start prednisone   daily for 5 days.  I have sent in a butyryl inhaler to use as  needed for your wheezing or shortness of breath.  May take Tessalon  3 times a day as needed for your cough.  Lots of rest and fluids.  Please follow-up with your PCP in 2 to 3 days for recheck.  Please go to the ER if you develop any worsening symptoms.  Hope you feel better soon!     ED Prescriptions     Medication Sig Dispense Auth. Provider   albuterol  (VENTOLIN  HFA) 108 (90 Base) MCG/ACT inhaler Inhale 1-2 puffs into the lungs every 6 (six) hours as needed. 1 each Solveig Fangman, Jodi R, NP   benzonatate  (TESSALON ) 200 MG capsule Take 1 capsule (200 mg total) by mouth 3 (three) times daily as needed. 20 capsule Anays Detore, Jodi R, NP   predniSONE  (DELTASONE ) 20 MG tablet Take 2 tablets (40 mg total) by mouth daily with breakfast for 5 days. 10 tablet Karmel Patricelli, Jodi R, NP      PDMP not reviewed this encounter.   Loreda Myla SAUNDERS, NP 12/29/23 [phone]

## 2023-12-29 NOTE — ED Triage Notes (Signed)
 Pt productive cough w/green mucous mainly at night, fatiguex2d.

## 2023-12-29 NOTE — Discharge Instructions (Signed)
 Start prednisone  daily for 5 days.  I have sent in a butyryl inhaler to use as needed for your wheezing or shortness of breath.  May take Tessalon  3 times a day as needed for your cough.  Lots of rest and fluids.  Please follow-up with your PCP in 2 to 3 days for recheck.  Please go to the ER if you develop any worsening symptoms.  Hope you feel better soon!
